# Patient Record
Sex: Male | Born: 1960 | Hispanic: Yes | Marital: Married | Smoking: Never smoker
Health system: Southern US, Community
[De-identification: ages and names within clinical notes are randomized; demographics above are authoritative.]

## PROBLEM LIST (undated history)

## (undated) DIAGNOSIS — F419 Anxiety disorder, unspecified: Secondary | ICD-10-CM

## (undated) DIAGNOSIS — T783XXA Angioneurotic edema, initial encounter: Secondary | ICD-10-CM

## (undated) DIAGNOSIS — L509 Urticaria, unspecified: Secondary | ICD-10-CM

## (undated) DIAGNOSIS — T7840XA Allergy, unspecified, initial encounter: Secondary | ICD-10-CM

## (undated) DIAGNOSIS — Z8619 Personal history of other infectious and parasitic diseases: Secondary | ICD-10-CM

## (undated) DIAGNOSIS — T63481A Toxic effect of venom of other arthropod, accidental (unintentional), initial encounter: Secondary | ICD-10-CM

## (undated) HISTORY — DX: Personal history of other infectious and parasitic diseases: Z86.19

## (undated) HISTORY — DX: Toxic effect of venom of other arthropod, accidental (unintentional), initial encounter: T63.481A

## (undated) HISTORY — DX: Anxiety disorder, unspecified: F41.9

## (undated) HISTORY — DX: Angioneurotic edema, initial encounter: T78.3XXA

## (undated) HISTORY — DX: Allergy, unspecified, initial encounter: T78.40XA

## (undated) HISTORY — DX: Urticaria, unspecified: L50.9

---
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
## 2008-09-18 HISTORY — PX: VASECTOMY: SHX75

## 2016-03-09 DIAGNOSIS — Z8042 Family history of malignant neoplasm of prostate: Secondary | ICD-10-CM | POA: Insufficient documentation

## 2016-03-09 DIAGNOSIS — Z833 Family history of diabetes mellitus: Secondary | ICD-10-CM | POA: Insufficient documentation

## 2016-03-09 DIAGNOSIS — F411 Generalized anxiety disorder: Secondary | ICD-10-CM | POA: Insufficient documentation

## 2016-03-31 HISTORY — PX: COLONOSCOPY: SHX174

## 2016-03-31 LAB — HM COLONOSCOPY

## 2017-01-17 ENCOUNTER — Telehealth: Payer: Self-pay | Admitting: *Deleted

## 2017-01-17 NOTE — Telephone Encounter (Signed)
PreVisit Call attempted. Left VM. 

## 2017-01-18 ENCOUNTER — Ambulatory Visit (INDEPENDENT_AMBULATORY_CARE_PROVIDER_SITE_OTHER): Payer: Commercial Managed Care - HMO | Admitting: Family Medicine

## 2017-01-18 ENCOUNTER — Encounter: Payer: Self-pay | Admitting: Family Medicine

## 2017-01-18 VITALS — BP 110/76 | HR 69 | Temp 98.7°F | Ht 68.0 in | Wt 237.5 lb

## 2017-01-18 DIAGNOSIS — M545 Low back pain, unspecified: Secondary | ICD-10-CM | POA: Insufficient documentation

## 2017-01-18 DIAGNOSIS — E669 Obesity, unspecified: Secondary | ICD-10-CM

## 2017-01-18 DIAGNOSIS — M65339 Trigger finger, unspecified middle finger: Secondary | ICD-10-CM | POA: Diagnosis not present

## 2017-01-18 DIAGNOSIS — M79641 Pain in right hand: Secondary | ICD-10-CM | POA: Insufficient documentation

## 2017-01-18 DIAGNOSIS — G8929 Other chronic pain: Secondary | ICD-10-CM | POA: Diagnosis not present

## 2017-01-18 DIAGNOSIS — M79642 Pain in left hand: Secondary | ICD-10-CM

## 2017-01-18 MED ORDER — TRAMADOL HCL 50 MG PO TABS: 50.0000 mg | ORAL_TABLET | Freq: Three times a day (TID) | ORAL | 0 refills | Status: DC | PRN

## 2017-01-18 MED ORDER — DICLOFENAC SODIUM 75 MG PO TBEC: 75.0000 mg | DELAYED_RELEASE_TABLET | Freq: Two times a day (BID) | ORAL | 0 refills | Status: DC

## 2017-01-18 MED ORDER — GABAPENTIN 300 MG PO CAPS: 300.0000 mg | ORAL_CAPSULE | Freq: Every day | ORAL | 3 refills | Status: DC
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
## 2017-01-20 DIAGNOSIS — S43102A Unspecified dislocation of left acromioclavicular joint, initial encounter: Secondary | ICD-10-CM | POA: Diagnosis not present

## 2017-01-20 DIAGNOSIS — M25512 Pain in left shoulder: Secondary | ICD-10-CM | POA: Diagnosis not present

## 2017-01-20 DIAGNOSIS — R0781 Pleurodynia: Secondary | ICD-10-CM | POA: Diagnosis not present

## 2017-01-22 ENCOUNTER — Ambulatory Visit (INDEPENDENT_AMBULATORY_CARE_PROVIDER_SITE_OTHER): Payer: Commercial Managed Care - HMO | Admitting: Sports Medicine

## 2017-01-22 ENCOUNTER — Encounter: Payer: Self-pay | Admitting: Sports Medicine

## 2017-01-22 ENCOUNTER — Ambulatory Visit: Payer: Self-pay

## 2017-01-22 VITALS — BP 100/70 | HR 61 | Ht 68.0 in | Wt 239.8 lb

## 2017-01-22 DIAGNOSIS — M545 Low back pain: Secondary | ICD-10-CM | POA: Diagnosis not present

## 2017-01-22 DIAGNOSIS — S43102A Unspecified dislocation of left acromioclavicular joint, initial encounter: Secondary | ICD-10-CM | POA: Diagnosis not present

## 2017-01-22 DIAGNOSIS — G5603 Carpal tunnel syndrome, bilateral upper limbs: Secondary | ICD-10-CM

## 2017-01-22 DIAGNOSIS — G8929 Other chronic pain: Secondary | ICD-10-CM | POA: Diagnosis not present
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
## 2017-01-23 DIAGNOSIS — S43102A Unspecified dislocation of left acromioclavicular joint, initial encounter: Secondary | ICD-10-CM | POA: Insufficient documentation

## 2017-01-23 DIAGNOSIS — G5603 Carpal tunnel syndrome, bilateral upper limbs: Secondary | ICD-10-CM | POA: Insufficient documentation

## 2017-01-23 HISTORY — DX: Unspecified dislocation of left acromioclavicular joint, initial encounter: S43.102A
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
## 2017-01-23 NOTE — Assessment & Plan Note (Signed)
This will need to be addressed follow-up appointments.  We did discuss that I do not prescribe chronic narcotics.  He would like referral for this happy to help facilitate referral to pain management.  Otherwise we will be we will try to look further into this at his next several appointments.

## 2017-01-23 NOTE — Assessment & Plan Note (Signed)
Patient will follow up with me in 2 weeks for this for reevaluation.  He is to be out of work for the next 1 week due to lifting obligations.  If any lack of improvement would consider prolonging this.  Will need weighted bilateral AC joint x-rays and follow-up

## 2017-01-30 ENCOUNTER — Telehealth: Payer: Self-pay | Admitting: Family Medicine

## 2017-01-30 NOTE — Telephone Encounter (Signed)
[facility]

## 2017-02-05 ENCOUNTER — Ambulatory Visit (INDEPENDENT_AMBULATORY_CARE_PROVIDER_SITE_OTHER): Payer: Commercial Managed Care - HMO | Admitting: Sports Medicine

## 2017-02-05 ENCOUNTER — Ambulatory Visit (INDEPENDENT_AMBULATORY_CARE_PROVIDER_SITE_OTHER): Payer: Commercial Managed Care - HMO

## 2017-02-05 ENCOUNTER — Encounter: Payer: Self-pay | Admitting: Sports Medicine

## 2017-02-05 VITALS — BP 130/82 | HR 58 | Ht 68.0 in | Wt 236.2 lb

## 2017-02-05 DIAGNOSIS — M79642 Pain in left hand: Secondary | ICD-10-CM | POA: Diagnosis not present

## 2017-02-05 DIAGNOSIS — M545 Low back pain: Secondary | ICD-10-CM | POA: Diagnosis not present

## 2017-02-05 DIAGNOSIS — S4992XA Unspecified injury of left shoulder and upper arm, initial encounter: Secondary | ICD-10-CM | POA: Diagnosis not present

## 2017-02-05 DIAGNOSIS — G5603 Carpal tunnel syndrome, bilateral upper limbs: Secondary | ICD-10-CM

## 2017-02-05 DIAGNOSIS — M25512 Pain in left shoulder: Secondary | ICD-10-CM | POA: Diagnosis not present

## 2017-02-05 DIAGNOSIS — S43102A Unspecified dislocation of left acromioclavicular joint, initial encounter: Secondary | ICD-10-CM

## 2017-02-05 DIAGNOSIS — G8929 Other chronic pain: Secondary | ICD-10-CM | POA: Diagnosis not present

## 2017-02-05 DIAGNOSIS — M79641 Pain in right hand: Secondary | ICD-10-CM | POA: Diagnosis not present

## 2017-02-05 MED ORDER — DICLOFENAC SODIUM 75 MG PO TBEC: 75.0000 mg | DELAYED_RELEASE_TABLET | Freq: Two times a day (BID) | ORAL | 0 refills | Status: DC

## 2017-02-05 MED ORDER — GABAPENTIN 300 MG PO CAPS: 300.0000 mg | ORAL_CAPSULE | Freq: Every day | ORAL | 3 refills | Status: DC

## 2017-02-05 NOTE — Assessment & Plan Note (Addendum)
Symptoms are consistent with carpal tunnel syndrome and are responding to carpal tunnel injections.  If any lack of improvement nerve conduction studies indicated as below.  Continue gabapentin
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
## 2017-02-05 NOTE — Assessment & Plan Note (Signed)
Short term follow-up with expected results at this time.  Only mild persistent symptoms.  If continues to be ongoing nerve conduction studies will be indicated

## 2017-02-05 NOTE — Assessment & Plan Note (Signed)
We did x-rays obtained today are reassuring that this is a stable grade 3 separation.  Given the extensive improvement that he has had anticipate he should do well without surgical intervention.  Discussed that he will have a cosmetic deformity in this area but functionally shoulder she did quite well.  Will need follow this up and ensure complete return to normal activities
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
## 2017-02-22 ENCOUNTER — Telehealth: Payer: Self-pay | Admitting: Family Medicine

## 2017-02-22 NOTE — Telephone Encounter (Signed)
We have requested records but have not received them. 

## 2017-02-22 NOTE — Telephone Encounter (Signed)
[facility] would like to know if we received records on this patient. Please advise.  °

## 2017-02-24 ENCOUNTER — Other Ambulatory Visit: Payer: Self-pay | Admitting: Sports Medicine

## 2017-02-24 DIAGNOSIS — M545 Low back pain: Principal | ICD-10-CM

## 2017-02-24 DIAGNOSIS — G8929 Other chronic pain: Secondary | ICD-10-CM

## 2017-03-01 ENCOUNTER — Telehealth: Payer: Self-pay | Admitting: Sports Medicine

## 2017-03-01 NOTE — Telephone Encounter (Signed)
Patient called to request that we write him a letter releasing him back to work as of 03/05/2017. He feels that his mobility is back to 85%+ and pain is zero to two out of ten.   I am going to put him on the schedule for tomorrow   Please call patient to advise how to proceed. Verified mobile

## 2017-03-01 NOTE — Telephone Encounter (Signed)
Keep scheduled visit, thanks!

## 2017-03-02 ENCOUNTER — Ambulatory Visit: Payer: Self-pay | Admitting: Sports Medicine

## 2017-03-02 ENCOUNTER — Ambulatory Visit (INDEPENDENT_AMBULATORY_CARE_PROVIDER_SITE_OTHER): Payer: 59 | Admitting: Psychology

## 2017-03-02 DIAGNOSIS — F411 Generalized anxiety disorder: Secondary | ICD-10-CM | POA: Diagnosis not present

## 2017-03-05 ENCOUNTER — Encounter: Payer: Self-pay | Admitting: Sports Medicine

## 2017-03-05 ENCOUNTER — Ambulatory Visit (INDEPENDENT_AMBULATORY_CARE_PROVIDER_SITE_OTHER): Payer: 59 | Admitting: Sports Medicine

## 2017-03-05 VITALS — BP 100/80 | HR 61 | Ht 68.0 in | Wt 240.0 lb

## 2017-03-05 DIAGNOSIS — G5603 Carpal tunnel syndrome, bilateral upper limbs: Secondary | ICD-10-CM | POA: Diagnosis not present

## 2017-03-05 DIAGNOSIS — S43102A Unspecified dislocation of left acromioclavicular joint, initial encounter: Secondary | ICD-10-CM | POA: Diagnosis not present

## 2017-03-05 NOTE — Progress Notes (Signed)
OFFICE VISIT NOTE Kyle Haynes. [facility] [phone]  [name] - 56 y.o. male MRN [id number]  Date of birth: 09-19-1960  Visit Date: 03/05/2017  PCP: [name], DO   Referred by: [name], DO  [name], cma acting as scribe for Dr. [name].  SUBJECTIVE:   Chief Complaint  Patient presents with  . Follow-up  . Bilateral Carpel Tunnel  . Left AC Seperation   HPI: As below and per problem based documentation when appropriate.   Bilateral Carpal Tunnel:  Improvement since steroid injections. He is taking Gabapentin 300 mg 1qhs. He reports occasionally numbess with intermittent sharp pains in wrist. Overall he feels much better.   AC separation, left, initial encounter: Improvement--intermittent tenderness with locking and cracking. His sleep is not being interrupted anymore. No knew symptoms. Taking diclofenac 75mg  bid with relief.  No new concerns at this time. He would like a work note to go back to work without restrictions.        Review of Systems  Constitutional: Negative for chills and fever.  HENT: Negative.   Eyes: Negative.   Respiratory: Negative.   Cardiovascular: Negative.  Negative for chest pain and palpitations.  Gastrointestinal: Negative.   Genitourinary: Negative.   Musculoskeletal: Positive for joint pain and myalgias.  Skin: Negative for itching and rash.  Neurological: Negative.  Negative for dizziness, tingling and headaches.  Endo/Heme/Allergies: Negative for environmental allergies and polydipsia. Does not bruise/bleed easily.  Psychiatric/Behavioral: Negative.     Otherwise per HPI.  HISTORY & PERTINENT PRIOR DATA:  No specialty comments available. He reports that he has never smoked. He has never used smokeless tobacco. No results for input(s): HGBA1C, LABURIC in the last 8760 hours. Medications & Allergies reviewed per EMR Patient Active Problem List   Diagnosis Date Noted  . Bipolar 2 disorder (HCC) 03/09/2017  . AC separation, left, initial encounter 01/23/2017  . Bilateral carpal tunnel syndrome 01/23/2017  . Chronic midline low back pain without sciatica 01/18/2017  . Trigger middle finger 01/18/2017  . Pain in both hands 01/18/2017  . Obesity (BMI 30.0-34.9) 01/18/2017  . Family history of diabetes mellitus type II 03/09/2016  . Family history of prostate cancer 03/09/2016  . GAD (generalized anxiety disorder) 03/09/2016   Past Medical History:  Diagnosis Date  . Allergy   . Anxiety   . History of chicken pox    History reviewed. No pertinent family history. Past Surgical History:  Procedure Laterality Date  . EYE SURGERY Left 1972   detached retina  . VASECTOMY  2010   Social History   Occupational History  . Not on file.   Social History Main Topics  . Smoking status: Never Smoker  . Smokeless tobacco: Never Used  . Alcohol use No  . Drug use: No  . Sexual activity: Yes    OBJECTIVE:  VS:  HT:5\' 8"  (172.7 cm)   WT:240 lb (108.9 kg)  BMI:36.6    BP:100/80  HR:61bpm  TEMP: ( )  RESP:96 % EXAM: Findings:  Bilateral upper extremities overall well aligned.  No significant deformity.  2+/4 and symmetric.  Upper extremity sensation is intact light touch. Bilateral radial pulses.  Left shoulder has forming over the left AC joint but is not painful to palpation.  He has no pain with overhead range of motion and has full flexion, extension, abduction, internal rotation and external rotation.  There is no significant dynamic subluxation although  he does have some motion when in this.  He is able to do a push-up type motion without pain.     No results found. ASSESSMENT & PLAN:   Problem List Items Addressed This Visit    AC separation, left, initial encounter - Primary    Grade 3 separation has healed remarkably well.  No significant work restrictions at this time.  He does understand that if this continues to  be mobile or shifting on him this can lead to increased wear on the joint and have provided him with shoulder stabilization exercises to be performed daily basis.      Bilateral carpal tunnel syndrome    Significantly improved following bilateral carpal tunnel injections.  No significant pain or persistent numbness or tingling.         Follow-up: Return if symptoms worsen or fail to improve.   CMA/ATC served as Neurosurgeonscribe during this visit. History, Physical, and Plan performed by medical provider. Documentation and orders reviewed and attested to.      [name], DO    [name]

## 2017-03-05 NOTE — Patient Instructions (Signed)
Please perform the exercise program that Fayrene FearingJames has prepared for you and gone over in detail on a daily basis.  In addition to the handout you were provided you can access your program through: [email or web address]   Your unique program code is: [email or web address]

## 2017-03-09 ENCOUNTER — Encounter: Payer: Self-pay | Admitting: Family Medicine

## 2017-03-09 ENCOUNTER — Ambulatory Visit (INDEPENDENT_AMBULATORY_CARE_PROVIDER_SITE_OTHER): Payer: Commercial Managed Care - HMO | Admitting: Family Medicine

## 2017-03-09 VITALS — BP 120/72 | HR 60 | Temp 98.0°F | Ht 68.0 in | Wt 242.0 lb

## 2017-03-09 DIAGNOSIS — E669 Obesity, unspecified: Secondary | ICD-10-CM

## 2017-03-09 DIAGNOSIS — F3181 Bipolar II disorder: Secondary | ICD-10-CM | POA: Insufficient documentation

## 2017-03-09 MED ORDER — LAMOTRIGINE 25 & 50 & 100 MG PO KIT: PACK | ORAL | 0 refills | Status: DC

## 2017-03-09 NOTE — Progress Notes (Signed)
Kyle Haynes is a 56 y.o. male is here for follow up.  History of Present Illness:   Kyle Haynes CMA acting as scribe for Dr. Juleen China.  HPI Patient comes in today to discuss being put on a mood stabilizer. He has been on Lamictal in the past that seemed to help. Following with Dr. Melburn Hake twice a month. Hx of rapid cycling. No SI/HI. No tobacco, ETOH, or drug use. Worried about drive to spend money.  Health Maintenance Due  Topic Date Due  . Hepatitis C Screening  [birth year]  . HIV Screening  01/10/1976  . TETANUS/TDAP  01/10/1980  . COLONOSCOPY  01/10/2011   PMHx, SurgHx, SocialHx, FamHx, Medications, and Allergies were reviewed in the Visit Navigator and updated as appropriate.   Patient Active Problem List   Diagnosis Date Noted  . Bipolar 2 disorder ([place]) 03/09/2017  . AC separation, left, initial encounter 01/23/2017  . Bilateral carpal tunnel syndrome 01/23/2017  . Chronic midline low back pain without sciatica 01/18/2017  . Trigger middle finger 01/18/2017  . Pain in both hands 01/18/2017  . Obesity (BMI 30.0-34.9) 01/18/2017  . Family history of diabetes mellitus type II 03/09/2016  . Family history of prostate cancer 03/09/2016  . GAD (generalized anxiety disorder) 03/09/2016   Social History  Substance Use Topics  . Smoking status: Never Smoker  . Smokeless tobacco: Never Used  . Alcohol use No   Current Medications and Allergies:   Current Outpatient Prescriptions:  .  diclofenac (VOLTAREN) 75 MG EC tablet, TAKE 1 TABLET BY MOUTH TWICE A DAY, Disp: 30 tablet, Rfl: 0 .  FLUoxetine (PROZAC) 20 MG capsule, Take 40 mg by mouth daily. , Disp: , Rfl:  .  gabapentin (NEURONTIN) 300 MG capsule, Take 1 capsule (300 mg total) by mouth at bedtime., Disp: 30 capsule, Rfl: 3  Not on File   Review of Systems   Review of Systems  Constitutional: Negative for chills, fever and malaise/fatigue.  HENT: Negative for ear pain, sinus pain and sore throat.   Eyes:  Negative for blurred vision and double vision.  Respiratory: Negative for cough, shortness of breath and wheezing.   Cardiovascular: Negative for chest pain, palpitations and leg swelling.  Gastrointestinal: Negative for abdominal pain, nausea and vomiting.  Musculoskeletal: Negative for back pain, joint pain and neck pain.  Neurological: Negative for dizziness and headaches.  Psychiatric/Behavioral: Negative for depression, hallucinations, memory loss and suicidal ideas.   Vitals:   Vitals:   03/09/17 1053  BP: 120/72  Pulse: 60  Temp: 98 F (36.7 C)  TempSrc: Oral  SpO2: 97%  Weight: 242 lb (109.8 kg)  Height: 5' 8"  (1.727 m)     Body mass index is 36.8 kg/m.   Physical Exam:   Physical Exam  Constitutional: He is oriented to person, place, and time. He appears well-developed and well-nourished. No distress.  HENT:  Head: Normocephalic and atraumatic.  Right Ear: External ear normal.  Left Ear: External ear normal.  Nose: Nose normal.  Mouth/Throat: Oropharynx is clear and moist.  Eyes: Conjunctivae and EOM are normal. Pupils are equal, round, and reactive to light.  Neck: Normal range of motion. Neck supple.  Cardiovascular: Normal rate, regular rhythm, normal heart sounds and intact distal pulses.   Pulmonary/Chest: Effort normal and breath sounds normal.  Abdominal: Soft. Bowel sounds are normal.  Musculoskeletal: Normal range of motion.  Neurological: He is alert and oriented to person, place, and time.  Skin: Skin is warm and  dry.  Psychiatric: He has a normal mood and affect. His behavior is normal. Judgment and thought content normal.  Nursing note and vitals reviewed.    Assessment and Plan:   Diagnoses and all orders for this visit:  Bipolar 2 disorder ([place]) Comments: After discussion, patient would like to start below medication. Expectations, risks, and potential side effects reviewed. Will recheck in one month. Orders: -     LamoTRIgine (LAMICTAL  ODT) 25 & 50 & 100 MG KIT; Orange starter pack.  Obesity (BMI 30.0-34.9) Comments: The patient is asked to make an attempt to improve diet and exercise patterns to aid in medical management of this problem.     . Reviewed expectations re: course of current medical issues. . Discussed self-management of symptoms. . Outlined signs and symptoms indicating need for more acute intervention. . Patient verbalized understanding and all questions were answered. Marland Kitchen Health Maintenance issues including appropriate healthy diet, exercise, and smoking avoidance were discussed with patient. . See orders for this visit as documented in the electronic medical record. . Patient received an After Visit Summary.  CMA served as Education administrator during this visit. History, Physical, and Plan performed by medical provider. The above documentation has been reviewed and is accurate and complete. Briscoe Deutscher, D.O.  Briscoe Deutscher, DO [place] 03/09/2017  Future Appointments Date Time Provider [place]  04/06/2017 11:00 AM Briscoe Deutscher, DO [facility] None

## 2017-03-18 NOTE — Assessment & Plan Note (Signed)
Grade 3 separation has healed remarkably well.  No significant work restrictions at this time.  He does understand that if this continues to be mobile or shifting on him this can lead to increased wear on the joint and have provided him with shoulder stabilization exercises to be performed daily basis.

## 2017-03-18 NOTE — Assessment & Plan Note (Signed)
Significantly improved following bilateral carpal tunnel injections.  No significant pain or persistent numbness or tingling.

## 2017-03-20 ENCOUNTER — Ambulatory Visit (INDEPENDENT_AMBULATORY_CARE_PROVIDER_SITE_OTHER): Payer: 59 | Admitting: Psychology

## 2017-03-20 DIAGNOSIS — F411 Generalized anxiety disorder: Secondary | ICD-10-CM | POA: Diagnosis not present

## 2017-04-06 ENCOUNTER — Ambulatory Visit: Payer: Self-pay | Admitting: Family Medicine

## 2017-04-06 DIAGNOSIS — Z0289 Encounter for other administrative examinations: Secondary | ICD-10-CM

## 2017-04-09 ENCOUNTER — Ambulatory Visit: Payer: Self-pay | Admitting: Family Medicine

## 2017-04-09 DIAGNOSIS — Z0289 Encounter for other administrative examinations: Secondary | ICD-10-CM

## 2017-04-13 ENCOUNTER — Ambulatory Visit (INDEPENDENT_AMBULATORY_CARE_PROVIDER_SITE_OTHER): Payer: 59 | Admitting: Family Medicine

## 2017-04-13 ENCOUNTER — Encounter: Payer: Self-pay | Admitting: Family Medicine

## 2017-04-13 VITALS — BP 118/70 | HR 59 | Temp 98.1°F | Ht 68.0 in | Wt 240.0 lb

## 2017-04-13 DIAGNOSIS — Z23 Encounter for immunization: Secondary | ICD-10-CM

## 2017-04-13 DIAGNOSIS — Z0000 Encounter for general adult medical examination without abnormal findings: Secondary | ICD-10-CM

## 2017-04-13 DIAGNOSIS — F3181 Bipolar II disorder: Secondary | ICD-10-CM

## 2017-04-13 DIAGNOSIS — Z1159 Encounter for screening for other viral diseases: Secondary | ICD-10-CM

## 2017-04-13 MED ORDER — LAMOTRIGINE 100 MG PO TABS
100.0000 mg | ORAL_TABLET | Freq: Every day | ORAL | 1 refills | Status: DC
Start: 2017-04-13 — End: 2017-09-30

## 2017-04-13 MED ORDER — FLUOXETINE HCL 20 MG PO CAPS: 40.0000 mg | ORAL_CAPSULE | Freq: Every day | ORAL | 2 refills | Status: DC

## 2017-04-13 NOTE — Progress Notes (Signed)
Kyle Haynes is a 56 y.o. male is here for follow up.  History of Present Illness:   [name] CMA acting as scribe for Dr. Juleen China.  HPI: Patient comes in today to follow up for his BiPolar disorder. He has been taking the Lamictal starter pack 25 mg, 50 mg, and finishing with 100 mg. He would like to stay on the 100 mg. He has been tolerating well with no side effects. He has seen a big difference in this mood. He has been taking Prozac 20 mg for several years. Tolerates Prozac as well. Will send in a refill of the Prozac 20 mg and Lamictal 100 mg today.   He is requesting a Physical because he has not had one several years. Would like routine labs today. Will order future fasting labs today.   Health Maintenance Due  Topic Date Due  . Hepatitis C Screening  [birth year] 1961  . HIV Screening  01/10/1976  . TETANUS/TDAP  01/10/1980  . COLONOSCOPY  01/10/2011   Depression screen PHQ 2/9 01/18/2017  Decreased Interest 0  Down, Depressed, Hopeless 0  PHQ - 2 Score 0   PMHx, SurgHx, SocialHx, FamHx, Medications, and Allergies were reviewed in the Visit Navigator and updated as appropriate.   Patient Active Problem List   Diagnosis Date Noted  . Bipolar 2 disorder ([place]) 03/09/2017  . AC separation, left, initial encounter 01/23/2017  . Bilateral carpal tunnel syndrome 01/23/2017  . Chronic midline low back pain without sciatica 01/18/2017  . Trigger middle finger 01/18/2017  . Pain in both hands 01/18/2017  . Obesity (BMI 30.0-34.9) 01/18/2017  . Family history of diabetes mellitus type II 03/09/2016  . Family history of prostate cancer 03/09/2016  . GAD (generalized anxiety disorder) 03/09/2016   Social History  Substance Use Topics  . Smoking status: Never Smoker  . Smokeless tobacco: Never Used  . Alcohol use No   Current Medications and Allergies:   .  diclofenac (VOLTAREN) 75 MG EC tablet, TAKE 1 TABLET BY MOUTH TWICE A DAY, Disp: 30 tablet, Rfl: 0 .  FLUoxetine  (PROZAC) 20 MG capsule, Take 40 mg by mouth daily. , Disp: , Rfl:  .  gabapentin (NEURONTIN) 300 MG capsule, Take 1 capsule (300 mg total) by mouth at bedtime., Disp: 30 capsule, Rfl: 3 .  LamoTRIgine (LAMICTAL ODT) 25 & 50 & 100 MG KIT, Orange starter pack., Disp: 35 each, Rfl: 0  No Known Allergies   Review of Systems   Pertinent items are noted in the HPI. Otherwise, ROS is negative.  Vitals:   Vitals:   04/13/17 1011  BP: 118/70  Pulse: (!) 59  Temp: 98.1 F (36.7 C)  TempSrc: Oral  SpO2: 97%  Weight: 240 lb (108.9 kg)  Height: _0  (1.727 m)     Body mass index is 36.49 kg/m.  Physical Exam:   Physical Exam  Constitutional: He is oriented to person, place, and time. He appears well-developed and well-nourished. No distress.  HENT:  Head: Normocephalic and atraumatic.  Right Ear: External ear normal.  Left Ear: External ear normal.  Nose: Nose normal.  Mouth/Throat: Oropharynx is clear and moist.  Eyes: Pupils are equal, round, and reactive to light. Conjunctivae and EOM are normal.  Neck: Normal range of motion. Neck supple.  Cardiovascular: Normal rate, regular rhythm, normal heart sounds and intact distal pulses.   Pulmonary/Chest: Effort normal and breath sounds normal.  Abdominal: Soft. Bowel sounds are normal.  Musculoskeletal: Normal range of  motion.  Neurological: He is alert and oriented to person, place, and time.  Skin: Skin is warm and dry.  Psychiatric: He has a normal mood and affect. His behavior is normal. Judgment and thought content normal.  Nursing note and vitals reviewed.   Assessment and Plan:   Kyle Haynes was seen today for follow-up.  Diagnoses and all orders for this visit:  Routine physical examination -     CBC with Differential/Platelet; Future -     Comprehensive metabolic panel; Future -     Lipid panel; Future -     Hemoglobin A1c; Future -     Hepatitis C antibody, reflex; Future  Bipolar 2 disorder  ([place]) Comments: Continue current treatment. Orders: -     CBC with Differential/Platelet; Future -     Comprehensive metabolic panel; Future -     Lipid panel; Future -     Hemoglobin A1c; Future -     lamoTRIgine (LAMICTAL) 100 MG tablet; Take 1 tablet (100 mg total) by mouth daily. -     FLUoxetine (PROZAC) 20 MG capsule; Take 2 capsules (40 mg total) by mouth daily.  Encounter for hepatitis C screening test for low risk patient -     Hepatitis C antibody, reflex; Future  Need for prophylactic vaccination against diphtheria-tetanus-pertussis (DTP) -     Tdap vaccine greater than or equal to 7yo IM   Patient Counseling: _0   Nutrition: Stressed importance of moderation in sodium/caffeine intake, saturated fat and cholesterol, caloric balance, sufficient intake of fresh fruits, vegetables, and fiber  _1   Stressed the importance of regular exercise.   _2   Substance Abuse: Discussed cessation/primary prevention of tobacco, alcohol, or other drug use; driving or other dangerous activities under the influence; availability of treatment for abuse.   _3   Injury prevention: Discussed safety belts, safety helmets, smoke detector, smoking near bedding or upholstery.   _4   Sexuality: Discussed sexually transmitted diseases, partner selection, use of condoms, avoidance of unintended pregnancy  and contraceptive alternatives.   _5   Dental health: Discussed importance of regular tooth brushing, flossing, and dental visits.  _6   Health maintenance and immunizations reviewed. Please refer to Health maintenance section.    . Reviewed expectations re: course of current medical issues. . Discussed self-management of symptoms. . Outlined signs and symptoms indicating need for more acute intervention. . Patient verbalized understanding and all questions were answered. Marland Kitchen Health Maintenance issues including appropriate healthy diet, exercise, and smoking avoidance were discussed with patient. . See  orders for this visit as documented in the electronic medical record. . Patient received an After Visit Summary.  CMA served as Education administrator during this visit. History, Physical, and Plan performed by medical provider. The above documentation has been reviewed and is accurate and complete. Briscoe Deutscher, D.O.  Briscoe Deutscher, DO [place] 04/14/2017  Future Appointments Date Time Provider [place]  04/19/2017 11:00 AM Shelor Sherrilyn Rist,  [facility] None  07/11/2017 1:45 PM Briscoe Deutscher, DO [facility] None

## 2017-04-19 ENCOUNTER — Ambulatory Visit: Payer: Self-pay | Admitting: Psychology

## 2017-05-25 DIAGNOSIS — N400 Enlarged prostate without lower urinary tract symptoms: Secondary | ICD-10-CM | POA: Diagnosis not present

## 2017-05-25 DIAGNOSIS — R109 Unspecified abdominal pain: Secondary | ICD-10-CM | POA: Diagnosis not present

## 2017-05-25 DIAGNOSIS — N281 Cyst of kidney, acquired: Secondary | ICD-10-CM | POA: Diagnosis not present

## 2017-05-25 DIAGNOSIS — R140 Abdominal distension (gaseous): Secondary | ICD-10-CM | POA: Diagnosis not present

## 2017-05-25 DIAGNOSIS — K5730 Diverticulosis of large intestine without perforation or abscess without bleeding: Secondary | ICD-10-CM | POA: Diagnosis not present

## 2017-07-11 ENCOUNTER — Ambulatory Visit: Payer: 59 | Admitting: Family Medicine

## 2017-07-11 ENCOUNTER — Ambulatory Visit (INDEPENDENT_AMBULATORY_CARE_PROVIDER_SITE_OTHER): Payer: 59 | Admitting: Family Medicine

## 2017-07-11 ENCOUNTER — Encounter: Payer: Self-pay | Admitting: Family Medicine

## 2017-07-11 VITALS — BP 100/70 | HR 67 | Ht 68.0 in | Wt 243.0 lb

## 2017-07-11 DIAGNOSIS — E782 Mixed hyperlipidemia: Secondary | ICD-10-CM | POA: Diagnosis not present

## 2017-07-11 DIAGNOSIS — Z0000 Encounter for general adult medical examination without abnormal findings: Secondary | ICD-10-CM | POA: Diagnosis not present

## 2017-07-11 DIAGNOSIS — F3181 Bipolar II disorder: Secondary | ICD-10-CM

## 2017-07-11 DIAGNOSIS — Z1159 Encounter for screening for other viral diseases: Secondary | ICD-10-CM | POA: Diagnosis not present

## 2017-07-11 LAB — COMPREHENSIVE METABOLIC PANEL
ALT: 18 U/L (ref 0–53)
AST: 16 U/L (ref 0–37)
Albumin: 4.1 g/dL (ref 3.5–5.2)
Alkaline Phosphatase: 75 U/L (ref 39–117)
BUN: 17 mg/dL (ref 6–23)
CO2: 24 mEq/L (ref 19–32)
Calcium: 9.4 mg/dL (ref 8.4–10.5)
Chloride: 107 mEq/L (ref 96–112)
Creatinine, Ser: 0.86 mg/dL (ref 0.40–1.50)
GFR: 97.6 mL/min (ref >60.00)
Glucose, Bld: 119 mg/dL — ABNORMAL HIGH (ref 70–99)
Potassium: 4 mEq/L (ref 3.5–5.1)
Sodium: 139 mEq/L (ref 135–145)
Total Bilirubin: 0.4 mg/dL (ref 0.2–1.2)
Total Protein: 6.8 g/dL (ref 6.0–8.3)

## 2017-07-11 LAB — CBC WITH DIFFERENTIAL/PLATELET
Basophils Absolute: 0 10*3/uL (ref 0.0–0.1)
Basophils Relative: 0.6 % (ref 0.0–3.0)
Eosinophils Absolute: 0.1 10*3/uL (ref 0.0–0.7)
Eosinophils Relative: 1.6 % (ref 0.0–5.0)
HCT: 41.7 % (ref 39.0–52.0)
Hemoglobin: 14.1 g/dL (ref 13.0–17.0)
Lymphocytes Relative: 34.5 % (ref 12.0–46.0)
Lymphs Abs: 1.6 10*3/uL (ref 0.7–4.0)
MCHC: 33.7 g/dL (ref 30.0–36.0)
MCV: 88 fl (ref 78.0–100.0)
Monocytes Absolute: 0.2 10*3/uL (ref 0.1–1.0)
Monocytes Relative: 5.1 % (ref 3.0–12.0)
Neutro Abs: 2.8 10*3/uL (ref 1.4–7.7)
Neutrophils Relative %: 58.2 % (ref 43.0–77.0)
Platelets: 292 10*3/uL (ref 150.0–400.0)
RBC: 4.74 Mil/uL (ref 4.22–5.81)
RDW: 13.7 % (ref 11.5–15.5)
WBC: 4.7 10*3/uL (ref 4.0–10.5)

## 2017-07-11 LAB — LIPID PANEL
Cholesterol: 214 mg/dL — ABNORMAL HIGH (ref 0–200)
HDL: 27.4 mg/dL — ABNORMAL LOW (ref >39.00)
Total CHOL/HDL Ratio: 8
Triglycerides: 447 mg/dL — ABNORMAL HIGH (ref 0.0–149.0)

## 2017-07-11 LAB — HEMOGLOBIN A1C: Hgb A1c MFr Bld: 5.9 % (ref 4.6–6.5)

## 2017-07-11 LAB — LDL CHOLESTEROL, DIRECT: Direct LDL: 106 mg/dL

## 2017-07-11 NOTE — Patient Instructions (Signed)
No changes today.  We will check blood work.  Come back to see Dr Earlene PlaterWallace in 6 months.  Take care,  Dr Jimmey RalphParker

## 2017-07-11 NOTE — Progress Notes (Signed)
    Subjective:  Kyle Haynes is a 56 y.o. male who presents today with a chief complaint of bipolar 2 follow-up.   HPI:  Bipolar 2 Disorder, chronic issue Currently on Prozac 20 mg daily and Lamictal 100 mg daily.  Stable on this dose.  Feels like his mood is doing well.  No irritability.  No depressed mood.  No suicidal ideation.  No noticeable side effects from his medications.  Preventative healthcare Reports that he had his colonoscopy about a months ago.  ROS: Per HPI  Objective:  Physical Exam: BP 100/70   Pulse 67   Ht 5\' 8"  (1.727 m)   Wt 243 lb (110.2 kg)   SpO2 96%   BMI 36.95 kg/m   Gen: NAD, resting comfortably Neuro: Grossly normal, moves all extremities Psych: Normal affect and thought content  Assessment/Plan:  Bipolar 2 disorder (HCC) Stable on Prozac and Lamictal.  No change today.  Follow-up with PCP in 6 months.  Katina Degreealeb M. Jimmey RalphParker, MD 07/11/2017 12:20 PM

## 2017-07-11 NOTE — Assessment & Plan Note (Signed)
Stable on Prozac and Lamictal.  No change today.  Follow-up with PCP in 6 months.

## 2017-07-12 LAB — HEPATITIS C ANTIBODY
Hepatitis C Ab: NONREACTIVE
SIGNAL TO CUT-OFF: 0.02 (ref <1.00)

## 2017-07-22 DIAGNOSIS — E782 Mixed hyperlipidemia: Secondary | ICD-10-CM | POA: Insufficient documentation

## 2017-07-22 MED ORDER — SIMVASTATIN 40 MG PO TABS: 40.0000 mg | ORAL_TABLET | Freq: Every day | ORAL | 3 refills | Status: DC

## 2017-07-22 NOTE — Addendum Note (Signed)
Addended by: [name], Viktoriya Glaspy R on: 07/22/2017 08:48 PM   Modules accepted: Orders

## 2017-08-01 ENCOUNTER — Other Ambulatory Visit: Payer: Self-pay | Admitting: Family Medicine

## 2017-08-01 DIAGNOSIS — F3181 Bipolar II disorder: Secondary | ICD-10-CM

## 2017-08-01 NOTE — Telephone Encounter (Signed)
Please advise on refill. Historical provider.  

## 2017-09-30 ENCOUNTER — Other Ambulatory Visit: Payer: Self-pay | Admitting: Family Medicine

## 2017-09-30 DIAGNOSIS — F3181 Bipolar II disorder: Secondary | ICD-10-CM

## 2017-11-04 ENCOUNTER — Other Ambulatory Visit: Payer: Self-pay | Admitting: Family Medicine

## 2017-11-04 DIAGNOSIS — F3181 Bipolar II disorder: Secondary | ICD-10-CM

## 2017-11-22 ENCOUNTER — Ambulatory Visit: Payer: 59 | Admitting: Sports Medicine

## 2017-11-22 ENCOUNTER — Encounter: Payer: Self-pay | Admitting: Sports Medicine

## 2017-11-22 VITALS — BP 102/72 | HR 56 | Ht 68.0 in

## 2017-11-22 DIAGNOSIS — M545 Low back pain: Secondary | ICD-10-CM

## 2017-11-22 DIAGNOSIS — S43102D Unspecified dislocation of left acromioclavicular joint, subsequent encounter: Secondary | ICD-10-CM | POA: Diagnosis not present

## 2017-11-22 DIAGNOSIS — M65331 Trigger finger, right middle finger: Secondary | ICD-10-CM | POA: Diagnosis not present

## 2017-11-22 DIAGNOSIS — M79642 Pain in left hand: Secondary | ICD-10-CM | POA: Diagnosis not present

## 2017-11-22 DIAGNOSIS — G8929 Other chronic pain: Secondary | ICD-10-CM

## 2017-11-22 DIAGNOSIS — M79641 Pain in right hand: Secondary | ICD-10-CM

## 2017-11-22 MED ORDER — IBUPROFEN-FAMOTIDINE 800-26.6 MG PO TABS: 1.0000 | ORAL_TABLET | Freq: Three times a day (TID) | ORAL | 2 refills | Status: DC | PRN

## 2017-11-22 MED ORDER — IBUPROFEN-FAMOTIDINE 800-26.6 MG PO TABS: 1.0000 | ORAL_TABLET | Freq: Three times a day (TID) | ORAL | 0 refills | Status: DC | PRN

## 2017-11-22 NOTE — Progress Notes (Signed)
Veverly FellsMichael D. Delorise Shinerigby, DO  [place] [facility] [place] [phone]  Kyle Haynes - 57 y.o. male MRN [id number]  Date of birth: Nov 21, 1960  Visit Date: 11/22/2017  PCP: [name], [name], DO   Referred by: [name], [name], DO   Scribe for today's visit: Stevenson ClinchBrandy Coleman, CMA     SUBJECTIVE:  Kyle Haynes is here for Initial Assessment (Trigger finger and back pain) .   Info from last visit on 03/05/17: Bilateral Carpal Tunnel:  Improvement since steroid injections. He is taking Gabapentin 300 mg 1qhs. He reports occasionally numbess with intermittent sharp pains in wrist. Overall he feels much better.  AC separation, left, initial encounter: Improvement--intermittent tenderness with locking and cracking. His sleep is not being interrupted anymore. No knew symptoms. Taking diclofenac 75mg  bid with relief. No new concerns at this time. He would like a work note to go back to work without restrictions.   11/22/17: Mid-line low back pain w/o sciatica Compared to the last office visit, his previously described symptoms are improving, pain is not as severe. Pain is worse when working, he does a lot of bending and lifting. He missed work Friday d/t pain. He reports some pain when lying in bed.  Current symptoms are moderate (4/10) & are non-radiating (no pain in gluteal region , hips, or groin).  He has tried IBU in the past with minimal relief. He has tried using heat and ice with minimal relief. He has also tried using a Scientist, clinical (histocompatibility and immunogenetics)massager with short term relief. He did PT for back pain years ago and didn't feel like it was beneficial.   He reports continued L shoulder pain. Pain has improved. He hears and feels cracking and popping when he moves his shoulder. ROM has improved.   Pt c/o catching and locking of 3rd finger R hand. He has had tenderness at the [facility]. He has noticed decreased grip strength. He has tried pulling his finger to release it but that caused  a lot of pain. He did get some relief with injections in the past. He denies swelling in hand/fingers.  He also c/o R elbow pain which is worse while sleeping.      ROS Denies night time disturbances. Denies fevers, chills, or night sweats. Denies unexplained weight loss. Denies personal history of cancer. Denies changes in bowel or bladder habits. Denies recent unreported falls. Denies new or worsening dyspnea or wheezing. Denies headaches or dizziness.  Reports numbness in hands while sleeping Denies dizziness or presyncopal episodes Reports lower extremity edema, bilateral feet   HISTORY & PERTINENT PRIOR DATA:  Prior History reviewed and updated per electronic medical record.  Significant/pertinent history, findings, studies include:  reports that he has never smoked. He has never used smokeless tobacco. Recent Labs    07/11/17 1130  HGBA1C 5.9   No specialty comments available. No problems updated.  OBJECTIVE:  VS:  HT:5\' 8"  (172.7 cm)   WT:   BMI:     BP:102/72  HR:(Abnormal) 56bpm  TEMP: ( )  RESP:96 %   PHYSICAL EXAM: Constitutional: WDWN, Non-toxic appearing. Psychiatric: Alert & appropriately interactive.  Not depressed or anxious appearing. Respiratory: No increased work of breathing.  Trachea Midline Eyes: Pupils are equal.  EOM intact without nystagmus.  No scleral icterus  Vascular Exam: warm to touch no edema  upper and lower extremity neuro exam: unremarkable normal strength normal sensation normal reflexes  MSK Exam: Right hand has triggering with small amount of pain  over the A1 pulley of the right hand.  He has a small amount of triggering over both fingers.  His left shoulder is overall healing well with only minimal pain.  He does have some back pain with straight leg raise but no significant lower extremity reflex abnormality or strength issues.  He does have markedly tight hamstrings.   ASSESSMENT & PLAN:   1. Trigger middle finger  of right hand   2. Pain in both hands   3. Chronic midline low back pain without sciatica   4. AC separation, left, initial encounter     PLAN: Anti-inflammatories and generalized low back pain exercises provided today without formal therapeutic exercise review.  Pain lack of improvement we will plan to inject his fingers as needed and follow-up with potential for formal physical therapy.  Follow-up: Return in about 4 weeks (around 12/20/2017).      Please see additional documentation for Objective, Assessment and Plan sections. Pertinent additional documentation may be included in corresponding procedure notes, imaging studies, problem based documentation and patient instructions. Please see these sections of the encounter for additional information regarding this visit.  CMA/ATC served as Neurosurgeon during this visit. History, Physical, and Plan performed by medical provider. Documentation and orders reviewed and attested to.      Andrena Mews, DO    [place] [facility] Physician

## 2017-11-22 NOTE — Patient Instructions (Signed)
[facility] instructions for Duexis, Pennsaid and Vimovo:  Your prescription will be filled through a mail order pharmacy.  It is typically [facility] but may vary depending on where you live.  You will receive a phone call from them which will typically come from a [phone] phone number.  You must speak directly to them to have this medication filled.  When the pharmacy calls, they will need your mailing address (for overnight shipment of the medication) and they will need payment information if you have a copay (typically no more than $10). If you have not heard from them 2-3 days after your appointment with Dr. Berline [facility], contact us at the office [phone]) or through MyChart so we can reach back out to the pharmacy.

## 2017-11-28 ENCOUNTER — Encounter: Payer: Self-pay | Admitting: Sports Medicine

## 2017-12-05 ENCOUNTER — Encounter: Payer: Self-pay | Admitting: Physician Assistant

## 2017-12-05 ENCOUNTER — Ambulatory Visit (INDEPENDENT_AMBULATORY_CARE_PROVIDER_SITE_OTHER): Payer: 59 | Admitting: Physician Assistant

## 2017-12-05 VITALS — BP 128/82 | HR 60 | Temp 98.4°F | Ht 68.0 in | Wt 244.2 lb

## 2017-12-05 DIAGNOSIS — J039 Acute tonsillitis, unspecified: Secondary | ICD-10-CM

## 2017-12-05 MED ORDER — AMOXICILLIN-POT CLAVULANATE 875-125 MG PO TABS
1.0000 | ORAL_TABLET | Freq: Two times a day (BID) | ORAL | 0 refills | Status: DC
Start: 2017-12-05 — End: 2018-02-12

## 2017-12-05 MED ORDER — GUAIFENESIN-CODEINE 100-10 MG/5ML PO SYRP: 5.0000 mL | ORAL_SOLUTION | Freq: Three times a day (TID) | ORAL | 0 refills | Status: DC | PRN

## 2017-12-05 NOTE — Progress Notes (Signed)
[facility] is a 57 y.o. male here for a new problem.   History of Present Illness:   Chief Complaint  Patient presents with  . Cough    Non-Productive   . Nasal Congestion  . Sore Throat    3-17 burning sensation    HPI   Throat has been burning for the past 3 days. Cough is mostly dry. Has developed nasal congestion. Has used saline nasal spray. Mother in law has been sick.  Has been taking Mucinex. Denies fever. Appetite decreased. Trying to push fluids. Denies SOB, stridor, chest pain.     Past Medical History:  Diagnosis Date  . Allergy   . Anxiety   . History of chicken pox      Social History   Socioeconomic History  . Marital status: Married    Spouse name: Not on file  . Number of children: Not on file  . Years of education: Not on file  . Highest education level: Not on file  Social Needs  . Financial resource strain: Not on file  . Food insecurity - worry: Not on file  . Food insecurity - inability: Not on file  . Transportation needs - medical: Not on file  . Transportation needs - non-medical: Not on file  Occupational History  . Not on file  Tobacco Use  . Smoking status: Never Smoker  . Smokeless tobacco: Never Used  Substance and Sexual Activity  . Alcohol use: No  . Drug use: No  . Sexual activity: Yes  Other Topics Concern  . Not on file  Social History Narrative  . Not on file    Past Surgical History:  Procedure Laterality Date  . EYE SURGERY Left 1972   detached retina  . VASECTOMY  2010    History reviewed. No pertinent family history.  No Known Allergies  Current Medications:   Current Outpatient Medications:  .  amoxicillin-clavulanate (AUGMENTIN) 875-125 MG tablet, Take 1 tablet by mouth 2 (two) times daily., Disp: 20 tablet, Rfl: 0 .  FLUoxetine (PROZAC) 20 MG capsule, TAKE 2 CAPSULES (40 MG TOTAL) BY MOUTH DAILY, Disp: 60 capsule, Rfl: 2 .  gabapentin (NEURONTIN) 300 MG capsule, Take 1 capsule (300 mg total) by  mouth at bedtime., Disp: 30 capsule, Rfl: 3 .  guaiFENesin-codeine (ROBITUSSIN AC) 100-10 MG/5ML syrup, Take 5 mLs by mouth 3 (three) times daily as needed for cough., Disp: 120 mL, Rfl: 0 .  Ibuprofen-Famotidine (DUEXIS) 800-26.6 MG TABS, Take 1 tablet by mouth 3 (three) times daily as needed. 1 tab po tid X 14 days then 1 tab po tid as needed, Disp: 90 tablet, Rfl: 2 .  Ibuprofen-Famotidine (DUEXIS) 800-26.6 MG TABS, Take 1 tablet by mouth 3 (three) times daily as needed., Disp: 9 tablet, Rfl: 0 .  lamoTRIgine (LAMICTAL) 100 MG tablet, TAKE 1 TABLET BY MOUTH EVERY DAY, Disp: 90 tablet, Rfl: 0 .  simvastatin (ZOCOR) 40 MG tablet, Take 1 tablet (40 mg total) at bedtime by mouth., Disp: 90 tablet, Rfl: 3   Review of Systems:   ROS  Negative unless otherwise specified per HPI.   Vitals:   Vitals:   12/05/17 1132  BP: 128/82  Pulse: 60  Temp: 98.4 F (36.9 C)  TempSrc: Oral  SpO2: 97%  Weight: 244 lb 3.2 oz (110.8 kg)  Height: 5\' 8"  (1.727 m)     Body mass index is 37.13 kg/m.  Physical Exam:   Physical Exam  Constitutional: He appears well-developed. He  is cooperative.  Non-toxic appearance. He does not have a sickly appearance. He does not appear ill. No distress.  HENT:  Head: Normocephalic and atraumatic.  Right Ear: Tympanic membrane, external ear and ear canal normal. Tympanic membrane is not erythematous, not retracted and not bulging.  Left Ear: Tympanic membrane, external ear and ear canal normal. Tympanic membrane is not erythematous, not retracted and not bulging.  Nose: Mucosal edema and rhinorrhea present. Right sinus exhibits no maxillary sinus tenderness and no frontal sinus tenderness. Left sinus exhibits no maxillary sinus tenderness and no frontal sinus tenderness.  Mouth/Throat: Uvula is midline and mucous membranes are normal. Posterior oropharyngeal erythema present. No posterior oropharyngeal edema. Tonsils are 3+ on the right. Tonsils are 3+ on the left.  Tonsillar exudate.  Eyes: Conjunctivae and lids are normal.  Neck: Trachea normal.  Cardiovascular: Normal rate, regular rhythm, S1 normal, S2 normal and normal heart sounds.  Pulmonary/Chest: Effort normal and breath sounds normal. He has no decreased breath sounds. He has no wheezes. He has no rhonchi. He has no rales.  Lymphadenopathy:    He has no cervical adenopathy.  Neurological: He is alert.  Skin: Skin is warm, dry and intact.  Psychiatric: He has a normal mood and affect. His speech is normal and behavior is normal.  Nursing note and vitals reviewed.   Assessment and Plan:    Nazire was seen today for cough, nasal congestion and sore throat.  Diagnoses and all orders for this visit:  Tonsillitis No red flags on exam.  Will initiate Augmentin and Cheratussin per orders. Discussed taking medications as prescribed. Reviewed return precautions including worsening fever, SOB, worsening cough or other concerns. Push fluids and rest. I recommend that patient follow-up if symptoms worsen or persist despite treatment x 7-10 days, sooner if needed.   Other orders -     amoxicillin-clavulanate (AUGMENTIN) 875-125 MG tablet; Take 1 tablet by mouth 2 (two) times daily. -     guaiFENesin-codeine (ROBITUSSIN AC) 100-10 MG/5ML syrup; Take 5 mLs by mouth 3 (three) times daily as needed for cough.    . Reviewed expectations re: course of current medical issues. . Discussed self-management of symptoms. . Outlined signs and symptoms indicating need for more acute intervention. . Patient verbalized understanding and all questions were answered. . See orders for this visit as documented in the electronic medical record. . Patient received an After-Visit Summary.  Jarold Motto, PA-C

## 2017-12-05 NOTE — Patient Instructions (Signed)
It was great to see you!  Use medication as prescribed: Augmentin and Cheratussin Push fluids and get plenty of rest. Please return if you are not improving as expected, or if you have high fevers (>101.5) or difficulty swallowing or worsening productive cough.  Call clinic with questions.  I hope you start feeling better soon!

## 2017-12-20 ENCOUNTER — Ambulatory Visit: Payer: 59 | Admitting: Sports Medicine

## 2018-01-09 ENCOUNTER — Ambulatory Visit: Payer: 59 | Admitting: Family Medicine

## 2018-01-23 ENCOUNTER — Encounter: Payer: Self-pay | Admitting: Sports Medicine

## 2018-02-11 ENCOUNTER — Other Ambulatory Visit: Payer: Self-pay | Admitting: Family Medicine

## 2018-02-11 DIAGNOSIS — F3181 Bipolar II disorder: Secondary | ICD-10-CM

## 2018-02-12 ENCOUNTER — Ambulatory Visit (INDEPENDENT_AMBULATORY_CARE_PROVIDER_SITE_OTHER): Payer: 59

## 2018-02-12 ENCOUNTER — Ambulatory Visit (INDEPENDENT_AMBULATORY_CARE_PROVIDER_SITE_OTHER): Payer: 59 | Admitting: Family Medicine

## 2018-02-12 ENCOUNTER — Encounter: Payer: Self-pay | Admitting: Family Medicine

## 2018-02-12 VITALS — BP 130/80 | HR 72 | Temp 98.6°F | Ht 68.0 in | Wt 244.2 lb

## 2018-02-12 DIAGNOSIS — M25552 Pain in left hip: Secondary | ICD-10-CM | POA: Diagnosis not present

## 2018-02-12 DIAGNOSIS — F3181 Bipolar II disorder: Secondary | ICD-10-CM | POA: Diagnosis not present

## 2018-02-12 DIAGNOSIS — M47816 Spondylosis without myelopathy or radiculopathy, lumbar region: Secondary | ICD-10-CM | POA: Diagnosis not present

## 2018-02-12 DIAGNOSIS — M1611 Unilateral primary osteoarthritis, right hip: Secondary | ICD-10-CM | POA: Diagnosis not present

## 2018-02-12 MED ORDER — CYCLOBENZAPRINE HCL 5 MG PO TABS: 5.0000 mg | ORAL_TABLET | Freq: Three times a day (TID) | ORAL | 0 refills | Status: DC | PRN

## 2018-02-12 MED ORDER — HYDROCODONE-ACETAMINOPHEN 5-325 MG PO TABS: 1.0000 | ORAL_TABLET | Freq: Four times a day (QID) | ORAL | 0 refills | Status: DC | PRN

## 2018-02-12 MED ORDER — PHENTERMINE HCL 37.5 MG PO TABS: 37.5000 mg | ORAL_TABLET | Freq: Every day | ORAL | 1 refills | Status: DC

## 2018-02-12 MED ORDER — LAMOTRIGINE 100 MG PO TABS: 100.0000 mg | ORAL_TABLET | Freq: Every day | ORAL | 3 refills | Status: DC

## 2018-02-12 MED ORDER — FLUOXETINE HCL 20 MG PO CAPS: 40.0000 mg | ORAL_CAPSULE | Freq: Every day | ORAL | 6 refills | Status: DC

## 2018-02-12 NOTE — Progress Notes (Signed)
[facility] is a 57 y.o. male is here for follow up.  History of Present Illness:  Barnie Mort, CMA acting as scribe for Dr. [name].   ZOX:WRUEAVW in office for follow up. Started having hip pain about seven weeks ago after getting out of car one day. Pain makes it hard to sleep or work with out pain. He has appointment for orthopedics. He has not had any x ray on back or hip. Pain has increased to point he has left work early due to pain.  He never started simvastatin. States never received prescription.   Health Maintenance Due  Topic Date Due  . COLONOSCOPY  01/10/2011   Depression screen PHQ 2/9 02/12/2018 01/18/2017  Decreased Interest 0 0  Down, Depressed, Hopeless 0 0  PHQ - 2 Score 0 0   PMHx, SurgHx, SocialHx, FamHx, Medications, and Allergies were reviewed in the Visit Navigator and updated as appropriate.   Patient Active Problem List   Diagnosis Date Noted  . Mixed hyperlipidemia 07/22/2017  . Bipolar 2 disorder (HCC) 03/09/2017  . AC separation, left, initial encounter 01/23/2017  . Bilateral carpal tunnel syndrome 01/23/2017  . Chronic midline low back pain without sciatica 01/18/2017  . Trigger middle finger 01/18/2017  . Pain in both hands 01/18/2017  . Obesity (BMI 30.0-34.9) 01/18/2017  . Family history of diabetes mellitus type II 03/09/2016  . Family history of prostate cancer 03/09/2016  . GAD (generalized anxiety disorder) 03/09/2016   Social History   Tobacco Use  . Smoking status: Never Smoker  . Smokeless tobacco: Never Used  Substance Use Topics  . Alcohol use: No  . Drug use: No   Current Medications and Allergies:   Current Outpatient Medications:  .  FLUoxetine (PROZAC) 20 MG capsule, Take 2 capsules (40 mg total) by mouth daily., Disp: 60 capsule, Rfl: 6 .  gabapentin (NEURONTIN) 300 MG capsule, Take 1 capsule (300 mg total) by mouth at bedtime., Disp: 30 capsule, Rfl: 3 .  lamoTRIgine (LAMICTAL) 100 MG tablet, Take 1  tablet (100 mg total) by mouth daily., Disp: 90 tablet, Rfl: 3  No Known Allergies Review of Systems   Pertinent items are noted in the HPI. Otherwise, ROS is negative.  Vitals:   Vitals:   02/12/18 1042  BP: 130/80  Pulse: 72  Temp: 98.6 F (37 C)  TempSrc: Oral  SpO2: 96%  Weight: 244 lb 3.2 oz (110.8 kg)  Height:  (1.727 m)     Body mass index is 37.13 kg/m.  Physical Exam:   Physical Exam  Constitutional: He is oriented to person, place, and time. He appears well-developed and well-nourished. No distress.  HENT:  Head: Normocephalic and atraumatic.  Right Ear: External ear normal.  Left Ear: External ear normal.  Nose: Nose normal.  Mouth/Throat: Oropharynx is clear and moist.  Eyes: Pupils are equal, round, and reactive to light. Conjunctivae and EOM are normal.  Neck: Normal range of motion. Neck supple.  Cardiovascular: Normal rate, regular rhythm, normal heart sounds and intact distal pulses.  Pulmonary/Chest: Effort normal and breath sounds normal.  Abdominal: Soft. Bowel sounds are normal.  Musculoskeletal:       Lumbar back: He exhibits decreased range of motion and spasm.       Back:  Neurological: He is alert and oriented to person, place, and time.  Skin: Skin is warm and dry.  Psychiatric: He has a normal mood and affect. His behavior is normal. Judgment and thought  content normal.  Nursing note and vitals reviewed.  Assessment and Plan:   Cowan was seen today for follow-up and groin pain.  Diagnoses and all orders for this visit:  Pain of left hip joint Comments: Likely strain/spasm. Waiting for xray reads. Orders: -     DG Lumbar Spine 2-3 Views; Future -     DG HIP UNILAT W OR W/O PELVIS 2-3 VIEWS LEFT; Future -     cyclobenzaprine (FLEXERIL) 5 MG tablet; Take 1 tablet (5 mg total) by mouth 3 (three) times daily as needed for muscle spasms. -     HYDROcodone-acetaminophen (NORCO/VICODIN) 5-325 MG tablet; Take 1 tablet by mouth  every 6 (six) hours as needed for moderate pain.  Bipolar 2 disorder (HCC) Comments: Doing well. Needs refill of medications today. Continue current treatment. Orders: -     FLUoxetine (PROZAC) 20 MG capsule; Take 2 capsules (40 mg total) by mouth daily. -     lamoTRIgine (LAMICTAL) 100 MG tablet; Take 1 tablet (100 mg total) by mouth daily.  Morbid obesity (HCC) Comments: Patient having a difficult time controlling hunger. Wants to try medication for weight loss. Reviewed benefits v risks of medication below.  Orders: -     phentermine (ADIPEX-P) 37.5 MG tablet; Take 1 tablet (37.5 mg total) by mouth daily before breakfast.   . Reviewed expectations re: course of current medical issues. . Discussed self-management of symptoms. . Outlined signs and symptoms indicating need for more acute intervention. . Patient verbalized understanding and all questions were answered. Marland Kitchen Health Maintenance issues including appropriate healthy diet, exercise, and smoking avoidance were discussed with patient. . See orders for this visit as documented in the electronic medical record. . Patient received an After Visit Summary.  [name], DO [place], [facility] [place] [facility] 02/17/2018  Future Appointments  Date Time Provider Department Center  04/15/2018  3:40 PM [name], DO [facility]   CMA served as scribe during this visit. History, Physical, and Plan performed by medical provider. The above documentation has been reviewed and is accurate and complete. [name], D.O.  Records requested if needed. Time spent with the patient: 45 minutes, of which >50% was spent in obtaining information about his symptoms, reviewing his previous labs, evaluations, and treatments, counseling him about his condition (please see the discussed topics above), and developing a plan to further investigate it; he had a number of questions which I addressed.

## 2018-02-13 ENCOUNTER — Telehealth: Payer: Self-pay | Admitting: Family Medicine

## 2018-02-13 NOTE — Telephone Encounter (Signed)
Copied from CRM [id number]. Topic: Quick Communication - Other Results >> Feb 13, 2018  1:56 PM [facility], Rosey Bath D wrote: Patient would like a call back with his Lumbar spine and hip x-ray results. Please call patient back, thanks.

## 2018-02-13 NOTE — Telephone Encounter (Signed)
Please advise 

## 2018-02-13 NOTE — Telephone Encounter (Signed)
See note

## 2018-02-14 NOTE — Telephone Encounter (Signed)
Results given.

## 2018-02-14 NOTE — Telephone Encounter (Signed)
See result note.  

## 2018-02-17 ENCOUNTER — Encounter: Payer: Self-pay | Admitting: Family Medicine

## 2018-03-01 DIAGNOSIS — M1612 Unilateral primary osteoarthritis, left hip: Secondary | ICD-10-CM | POA: Diagnosis not present

## 2018-03-13 DIAGNOSIS — M1612 Unilateral primary osteoarthritis, left hip: Secondary | ICD-10-CM | POA: Diagnosis not present

## 2018-03-15 ENCOUNTER — Ambulatory Visit (INDEPENDENT_AMBULATORY_CARE_PROVIDER_SITE_OTHER): Payer: Self-pay | Admitting: Psychology

## 2018-03-15 DIAGNOSIS — F411 Generalized anxiety disorder: Secondary | ICD-10-CM

## 2018-04-11 ENCOUNTER — Ambulatory Visit: Payer: Self-pay | Admitting: Psychology

## 2018-04-11 DIAGNOSIS — F411 Generalized anxiety disorder: Secondary | ICD-10-CM

## 2018-04-14 DIAGNOSIS — M25579 Pain in unspecified ankle and joints of unspecified foot: Secondary | ICD-10-CM | POA: Insufficient documentation

## 2018-04-14 NOTE — Progress Notes (Deleted)
[facility] is a 57 y.o. male is here for follow up.  History of Present Illness:   HPI: Health Maintenance Due  Topic Date Due  . COLONOSCOPY  01/10/2011   Depression screen PHQ 2/9 02/12/2018 01/18/2017  Decreased Interest 0 0  Down, Depressed, Hopeless 0 0  PHQ - 2 Score 0 0   PMHx, SurgHx, SocialHx, FamHx, Medications, and Allergies were reviewed in the Visit Navigator and updated as appropriate.   Patient Active Problem List   Diagnosis Date Noted  . Pain in joint involving ankle and foot 04/14/2018  . Osteoarthritis of left hip 03/13/2018  . Mixed hyperlipidemia 07/22/2017  . Bipolar 2 disorder (HCC) 03/09/2017  . AC separation, left, initial encounter 01/23/2017  . Bilateral carpal tunnel syndrome 01/23/2017  . Chronic midline low back pain without sciatica 01/18/2017  . Trigger middle finger 01/18/2017  . Pain in both hands 01/18/2017  . Obesity (BMI 30.0-34.9) 01/18/2017  . Family history of diabetes mellitus type II 03/09/2016  . Family history of prostate cancer 03/09/2016  . GAD (generalized anxiety disorder) 03/09/2016   Social History   Tobacco Use  . Smoking status: Never Smoker  . Smokeless tobacco: Never Used  Substance Use Topics  . Alcohol use: No  . Drug use: No   Current Medications and Allergies:   Current Outpatient Medications:  .  cyclobenzaprine (FLEXERIL) 5 MG tablet, Take 1 tablet (5 mg total) by mouth 3 (three) times daily as needed for muscle spasms., Disp: 30 tablet, Rfl: 0 .  FLUoxetine (PROZAC) 20 MG capsule, Take 2 capsules (40 mg total) by mouth daily., Disp: 60 capsule, Rfl: 6 .  gabapentin (NEURONTIN) 300 MG capsule, Take 1 capsule (300 mg total) by mouth at bedtime., Disp: 30 capsule, Rfl: 3 .  HYDROcodone-acetaminophen (NORCO/VICODIN) 5-325 MG tablet, Take 1 tablet by mouth every 6 (six) hours as needed for moderate pain., Disp: 10 tablet, Rfl: 0 .  lamoTRIgine (LAMICTAL) 100 MG tablet, Take 1 tablet (100 mg total) by mouth  daily., Disp: 90 tablet, Rfl: 3 .  phentermine (ADIPEX-P) 37.5 MG tablet, Take 1 tablet (37.5 mg total) by mouth daily before breakfast., Disp: 30 tablet, Rfl: 1  No Known Allergies Review of Systems   Pertinent items are noted in the HPI. Otherwise, ROS is negative.  Vitals:  There were no vitals filed for this visit.   There is no height or weight on file to calculate BMI.  Physical Exam:   Physical Exam Results for orders placed or performed in visit on 07/11/17  CBC with Differential/Platelet  Result Value Ref Range   WBC 4.7 4.0 - 10.5 K/uL   RBC 4.74 4.22 - 5.81 Mil/uL   Hemoglobin 14.1 13.0 - 17.0 g/dL   HCT 16.141.7 09.639.0 - 04.552.0 %   MCV 88.0 78.0 - 100.0 fl   MCHC 33.7 30.0 - 36.0 g/dL   RDW 40.913.7 81.111.5 - 91.415.5 %   Platelets 292.0 150.0 - 400.0 K/uL   Neutrophils Relative % 58.2 43.0 - 77.0 %   Lymphocytes Relative 34.5 12.0 - 46.0 %   Monocytes Relative 5.1 3.0 - 12.0 %   Eosinophils Relative 1.6 0.0 - 5.0 %   Basophils Relative 0.6 0.0 - 3.0 %   Neutro Abs 2.8 1.4 - 7.7 K/uL   Lymphs Abs 1.6 0.7 - 4.0 K/uL   Monocytes Absolute 0.2 0.1 - 1.0 K/uL   Eosinophils Absolute 0.1 0.0 - 0.7 K/uL   Basophils Absolute 0.0 0.0 -  0.1 K/uL  Comprehensive metabolic panel  Result Value Ref Range   Sodium 139 135 - 145 mEq/L   Potassium 4.0 3.5 - 5.1 mEq/L   Chloride 107 96 - 112 mEq/L   CO2 24 19 - 32 mEq/L   Glucose, Bld 119 (H) 70 - 99 mg/dL   BUN 17 6 - 23 mg/dL   Creatinine, Ser 1.19 0.40 - 1.50 mg/dL   Total Bilirubin 0.4 0.2 - 1.2 mg/dL   Alkaline Phosphatase 75 39 - 117 U/L   AST 16 0 - 37 U/L   ALT 18 0 - 53 U/L   Total Protein 6.8 6.0 - 8.3 g/dL   Albumin 4.1 3.5 - 5.2 g/dL   Calcium 9.4 8.4 - 14.7 mg/dL   GFR 82.95 >62.13 mL/min  Lipid panel  Result Value Ref Range   Cholesterol 214 (H) 0 - 200 mg/dL   Triglycerides (H) 0.0 - 149.0 mg/dL    086.5 Triglyceride is over 400; calculations on Lipids are invalid.   HDL 27.40 (L) >39.00 mg/dL   Total CHOL/HDL Ratio 8     Hemoglobin A1c  Result Value Ref Range   Hgb A1c MFr Bld 5.9 4.6 - 6.5 %  LDL cholesterol, direct  Result Value Ref Range   Direct LDL 106.0 mg/dL  Hepatitis C antibody  Result Value Ref Range   Hepatitis C Ab NON-REACTIVE NON-REACTI   SIGNAL TO CUT-OFF 0.02 <1.00    Assessment and Plan:   There are no diagnoses linked to this encounter.  . Reviewed expectations re: course of current medical issues. . Discussed self-management of symptoms. . Outlined signs and symptoms indicating need for more acute intervention. . Patient verbalized understanding and all questions were answered. Marland Kitchen Health Maintenance issues including appropriate healthy diet, exercise, and smoking avoidance were discussed with patient. . See orders for this visit as documented in the electronic medical record. . Patient received an After Visit Summary.  [name], DO , [facility] 04/14/2018  Future Appointments  Date Time Provider Department Center  04/15/2018  3:40 PM [name], DO [facility]  04/25/2018  2:00 PM [facility], [place] [facility] None  05/01/2018 12:00 PM Shelor [facility]ona [place], [place] [facility] None  05/10/2018 12:00 PM Shelor [facility] [place], [place] [facility] None    *** CMA served as scribe during this visit. History, Physical, and Plan performed by medical provider. The above documentation has been reviewed and is accurate and complete. [name], D.O.

## 2018-04-15 ENCOUNTER — Ambulatory Visit: Payer: 59 | Admitting: Family Medicine

## 2018-04-15 DIAGNOSIS — Z0289 Encounter for other administrative examinations: Secondary | ICD-10-CM

## 2018-04-18 ENCOUNTER — Encounter: Payer: Self-pay | Admitting: Family Medicine

## 2018-04-25 ENCOUNTER — Ambulatory Visit: Payer: Self-pay | Admitting: Psychology

## 2018-05-01 ENCOUNTER — Ambulatory Visit: Payer: Self-pay | Admitting: Psychology

## 2018-05-05 ENCOUNTER — Encounter (HOSPITAL_COMMUNITY): Payer: Self-pay | Admitting: Emergency Medicine

## 2018-05-05 ENCOUNTER — Other Ambulatory Visit: Payer: Self-pay

## 2018-05-05 ENCOUNTER — Emergency Department (HOSPITAL_COMMUNITY)
Admission: EM | Admit: 2018-05-05 | Discharge: 2018-05-05 | Disposition: A | Discharge status: Home / Self Care | Payer: 59 | Attending: Emergency Medicine | Admitting: Emergency Medicine

## 2018-05-05 DIAGNOSIS — Z79899 Other long term (current) drug therapy: Secondary | ICD-10-CM | POA: Insufficient documentation

## 2018-05-05 DIAGNOSIS — R0602 Shortness of breath: Secondary | ICD-10-CM | POA: Diagnosis not present

## 2018-05-05 DIAGNOSIS — R0902 Hypoxemia: Secondary | ICD-10-CM | POA: Diagnosis not present

## 2018-05-05 DIAGNOSIS — R079 Chest pain, unspecified: Secondary | ICD-10-CM | POA: Insufficient documentation

## 2018-05-05 DIAGNOSIS — T7840XA Allergy, unspecified, initial encounter: Secondary | ICD-10-CM | POA: Diagnosis not present

## 2018-05-05 DIAGNOSIS — T63441A Toxic effect of venom of bees, accidental (unintentional), initial encounter: Secondary | ICD-10-CM | POA: Diagnosis not present

## 2018-05-05 DIAGNOSIS — T782XXA Anaphylactic shock, unspecified, initial encounter: Secondary | ICD-10-CM | POA: Diagnosis not present

## 2018-05-05 MED ORDER — PREDNISONE 20 MG PO TABS: 40.0000 mg | ORAL_TABLET | Freq: Every day | ORAL | 0 refills | Status: AC

## 2018-05-05 MED ORDER — FAMOTIDINE IN NACL 20-0.9 MG/50ML-% IV SOLN
20.0000 mg | Freq: Once | INTRAVENOUS | Status: AC
Start: 2018-05-05 — End: 2018-05-05
  Administered 2018-05-05: 20 mg via INTRAVENOUS
  Filled 2018-05-05: qty 50

## 2018-05-05 MED ORDER — ONDANSETRON 4 MG PO TBDP: 4.0000 mg | ORAL_TABLET | Freq: Three times a day (TID) | ORAL | 0 refills | Status: DC | PRN

## 2018-05-05 MED ORDER — EPINEPHRINE 0.3 MG/0.3ML IJ SOAJ: INTRAMUSCULAR | 1 refills | Status: DC

## 2018-05-05 MED ORDER — DIPHENHYDRAMINE HCL 50 MG/ML IJ SOLN
50.0000 mg | Freq: Once | INTRAMUSCULAR | Status: AC
Start: 2018-05-05 — End: 2018-05-05
  Administered 2018-05-05: 50 mg via INTRAVENOUS
  Filled 2018-05-05: qty 1

## 2018-05-05 NOTE — ED Provider Notes (Signed)
[facility] EMERGENCY DEPARTMENT Provider Note   CSN: [id number] Arrival date & time: 05/05/18  1636     History   Chief Complaint Chief Complaint  Patient presents with  . Allergic Reaction    HPI Kyle Haynes is a 57 y.o. male who presents today by EMS for evaluation of anaphylactic reaction.  Patient was stung approximately 4 times by yellow jackets and then rapidly became diaphoretic with shortness of breath, chest tightness, hives, facial swelling, and numbness to hands and lips.  His wife is an Charity fundraiser and gave him 50 mg of Benadryl, however then he started vomiting and having diarrhea.  They suspect that he vomited up the Benadryl.  He was given 0.3 IM of epi, 125 Solu-Medrol, 1 L normal saline by EMS.  Upon EMS arrival his blood pressure was 70/40 manually which improved after fluid bolus.  His nausea, vomiting, shortness of breath, chest tightness, and facial swelling has resolved.  He still reports itchy rash.  His sting sites include his toe, left anterior chest, and buttock.   HPI  Past Medical History:  Diagnosis Date  . Allergy   . Anxiety   . History of chicken pox     Patient Active Problem List   Diagnosis Date Noted  . Pain in joint involving ankle and foot 04/14/2018  . Osteoarthritis of left hip 03/13/2018  . Mixed hyperlipidemia 07/22/2017  . Bipolar 2 disorder (HCC) 03/09/2017  . AC separation, left, initial encounter 01/23/2017  . Bilateral carpal tunnel syndrome 01/23/2017  . Chronic midline low back pain without sciatica 01/18/2017  . Trigger middle finger 01/18/2017  . Pain in both hands 01/18/2017  . Obesity (BMI 30.0-34.9) 01/18/2017  . Family history of diabetes mellitus type II 03/09/2016  . Family history of prostate cancer 03/09/2016  . GAD (generalized anxiety disorder) 03/09/2016    Past Surgical History:  Procedure Laterality Date  . EYE SURGERY Left 1972   detached retina  . VASECTOMY  2010        Home  Medications    Prior to Admission medications   Medication Sig Start Date End Date Taking? Authorizing Provider  FLUoxetine (PROZAC) 20 MG capsule Take 2 capsules (40 mg total) by mouth daily. 02/12/18  Yes [name], DO  ibuprofen (ADVIL,MOTRIN) 200 MG tablet Take 400 mg by mouth every 6 (six) hours as needed for headache (pain).   Yes [provider]  lamoTRIgine (LAMICTAL) 100 MG tablet Take 1 tablet (100 mg total) by mouth daily. 02/12/18  Yes [name], DO  phentermine (ADIPEX-P) 37.5 MG tablet Take 1 tablet (37.5 mg total) by mouth daily before breakfast. 02/12/18  Yes [name], DO  simvastatin (ZOCOR) 40 MG tablet Take 40 mg by mouth daily.   Yes [provider]  cyclobenzaprine (FLEXERIL) 5 MG tablet Take 1 tablet (5 mg total) by mouth 3 (three) times daily as needed for muscle spasms. Patient not taking: Reported on 05/05/2018 02/12/18   [name], DO  EPINEPHrine 0.3 mg/0.3 mL IJ SOAJ injection Inject one device into the side of the thigh for severe allergic reactions.  Repeat with second device in 5-10 minutes if needed, call 9-11 05/05/18   Cristina Gong, PA-C  gabapentin (NEURONTIN) 300 MG capsule Take 1 capsule (300 mg total) by mouth at bedtime. Patient not taking: Reported on 05/05/2018 02/05/17   Andrena Mews, DO  HYDROcodone-acetaminophen (NORCO/VICODIN) 5-325 MG tablet Take 1 tablet by mouth every 6 (six) hours as  needed for moderate pain. Patient not taking: Reported on 05/05/2018 02/12/18   [name], [name], DO  ondansetron (ZOFRAN ODT) 4 MG disintegrating tablet Take 1 tablet (4 mg total) by mouth every 8 (eight) hours as needed for nausea or vomiting. 05/05/18   Cristina GongHammond, Ileana Chalupa W, PA-C  predniSONE (DELTASONE) 20 MG tablet Take 2 tablets (40 mg total) by mouth daily for 2 days. 05/05/18 05/07/18  Cristina GongHammond, Airyanna Dipalma W, PA-C    Family History History reviewed. No pertinent family history.  Social History Social History   Tobacco Use    . Smoking status: Never Smoker  . Smokeless tobacco: Never Used  Substance Use Topics  . Alcohol use: No  . Drug use: No     Allergies   Yellow jacket venom [bee venom]   Review of Systems Review of Systems  Constitutional: Negative for chills and fever.  HENT: Negative for congestion.   Eyes: Negative for visual disturbance.  Respiratory: Positive for chest tightness and shortness of breath.   Cardiovascular: Positive for chest pain.  Gastrointestinal: Positive for abdominal pain, diarrhea, nausea and vomiting.  Skin: Positive for color change and rash.  Neurological: Negative for weakness and headaches.  Psychiatric/Behavioral: Negative for confusion.  All other systems reviewed and are negative.  Chest tightness, shortness of breath, chest pain, abdominal pain, N/v/D, have all resolved  Physical Exam Updated Vital Signs BP 105/61   Pulse 63   Temp 97.7 F (36.5 C) (Oral)   Resp 11   SpO2 98%   Physical Exam  Constitutional: He is oriented to person, place, and time. He appears well-developed and well-nourished.  HENT:  Head: Normocephalic and atraumatic.  Mouth/Throat: Oropharynx is clear and moist.  Eyes: Conjunctivae are normal.  Neck: Normal range of motion. Neck supple.  Cardiovascular: Normal rate, regular rhythm, normal heart sounds and intact distal pulses.  No murmur heard. Pulmonary/Chest: Effort normal and breath sounds normal. No stridor. No respiratory distress. He has no wheezes.  Abdominal: Soft. Bowel sounds are normal. He exhibits no distension and no mass. There is no tenderness. There is no guarding.  Musculoskeletal: He exhibits no edema.  Neurological: He is alert and oriented to person, place, and time.  Skin: Skin is warm and dry. He is not diaphoretic.  Diffuse urticarial rash present over chest, neck, back, abdomen and extremities.   Psychiatric: He has a normal mood and affect. His behavior is normal.  Nursing note and vitals  reviewed.    ED Treatments / Results  Labs (all labs ordered are listed, but only abnormal results are displayed) Labs Reviewed - No data to display  EKG EKG Interpretation  Date/Time:  Sunday May 05 2018 16:43:16 EDT Ventricular Rate:  59 PR Interval:    QRS Duration: 110 QT Interval:  459 QTC Calculation: 455 R Axis:   48 Text Interpretation:  Sinus rhythm RSR' in V1 or V2, probably normal variant Minimal ST elevation, anterior leads No prior ECG for evaluiation.  No STEMI Confirmed by Theda Belfastegeler, Chris ([id number]) on 05/05/2018 5:09:06 PM   Radiology No results found.  Procedures Procedures (including critical care time)  Medications Ordered in ED Medications  diphenhydrAMINE (BENADRYL) injection 50 mg (50 mg Intravenous Given 05/05/18 1707)  famotidine (PEPCID) IVPB 20 mg premix ( Intravenous Stopped 05/05/18 1746)     Initial Impression / Assessment and Plan / ED Course  I have reviewed the triage vital signs and the nursing notes.  Pertinent labs & imaging results that were available during my care  of the patient were reviewed by me and considered in my medical decision making (see chart for details).  Clinical Course as of May 06 32  Wynelle LinkSun May 05, 2018  2213 Patient reevaluated, his rash is fully resolved, he denies any symptoms at this time and states he is ready to go home.  Discussed at length with patient and his wife cold return precautions, along with EpiPen uses and they stated their understanding.   [EH]    Clinical Course User Index [EH] Cristina GongHammond, Silvia Markuson W, PA-C   Presents today for evaluation of anaphylactic reaction.  He was given Benadryl by his wife prior to EMS arrival, however that was p.o. when he vomited multiple times minutes after.  EMS gave him IV fluids, Solu-Medrol, and epi after which his blood pressure improved and he began feeling better.  He was given 50 mg of Benadryl IV, along with Pepcid IV which fully resolved his hives.  He was observed  in the emergency room for a full 6 hours after epinephrine administration without evidence of recurrent reaction.  He was discharged with prescription for EpiPen's, 2 days of prednisone, along with instructions on taking Benadryl.  Return precautions were discussed with patient who states their understanding.  At the time of discharge patient denied any unaddressed complaints or concerns.  Patient is agreeable for discharge home.   Final Clinical Impressions(s) / ED Diagnoses   Final diagnoses:  Anaphylaxis, initial encounter  Bee sting reaction, accidental or unintentional, initial encounter    ED Discharge Orders         Ordered    EPINEPHrine 0.3 mg/0.3 mL IJ SOAJ injection     05/05/18 2217    predniSONE (DELTASONE) 20 MG tablet  Daily     05/05/18 2217    ondansetron (ZOFRAN ODT) 4 MG disintegrating tablet  Every 8 hours PRN     05/05/18 2217           Cristina GongHammond, Ameir Faria W, PA-C 05/06/18 0033    Tegeler, Canary Brimhristopher J, MD 05/06/18 1135

## 2018-05-05 NOTE — ED Triage Notes (Signed)
Per [facility], Pt was stung approximately 4 times by yellow jackets. Pt became diaphoretic, SHOB, hives to chest, numbness to hands and lips. Pt took 50 mg of benadryl at home. Pt received 0.3 of epi, 125 mg of solu medrol, and 1 L of NS. Pt's initial BP 70/40. Repeat 120 BP (p). Pt alert, in no acute distress at this time. Pt was stung by yellow jackets approximately a month ago with only a local reaction.

## 2018-05-05 NOTE — Discharge Instructions (Addendum)
Today you were seen and evaluated for an allergic reaction.  You were given multiple medications in the emergency room including IV benadryl (which may make you sleepy) and IV steroids.    If you develop any concerning symptoms, especially feeling like your throat is closing, shortness of breath, swelling of the face, lips or tongue then please return to the emergency room immediately for evaluation.    For the next 24 hours please take 1 pill (25 mg) of benadryl every 6 hours.  If you have mild symptoms such as itching, mild nausea you may take a second benadryl.  After 24 hours please start taking zyrtec if you still have symptoms or mild symptoms.  While the box tells you to take one pill every 24 hours I want you to take one pill every 12 hours for the next few days.  This will make you sleepy and you should not drive, operate heavy machinery or perform any task where if you were to fall asleep or make a slow decision it could cause harm to you or someone else.   You have also been given a prescription for 5 days of steroids.  Please take your first dose about 24 hours after you were given steroids in the emergency room.  Steroids may give you the feeling of being hot, increased appetite, and extra energy.    I have given you a prescription for two epi pens.  Please make sure you keep one with you at all times.  Please consider keeping two pills of benadryl with your epi pen and if you use it take the benadryl and call 9-11.  Please do not store epi-pens in your car or allow them to get very hot or cold as this can make them not work as well.

## 2018-05-08 ENCOUNTER — Encounter: Payer: Self-pay | Admitting: Family Medicine

## 2018-05-08 ENCOUNTER — Ambulatory Visit: Payer: 59 | Admitting: Family Medicine

## 2018-05-08 VITALS — BP 116/78 | HR 58 | Temp 98.0°F | Ht 68.0 in | Wt 222.0 lb

## 2018-05-08 DIAGNOSIS — Z87892 Personal history of anaphylaxis: Secondary | ICD-10-CM

## 2018-05-08 MED ORDER — PHENTERMINE HCL 37.5 MG PO TABS: 37.5000 mg | ORAL_TABLET | Freq: Every day | ORAL | 2 refills | Status: DC

## 2018-05-08 NOTE — Progress Notes (Signed)
Kyle Haynes is a 57 y.o. male is here for follow up.  History of Present Illness:   HPI: ED visit for anaphylaxis from wasp sting. Note and treatment reviewed. Feeling better. Has Epipen.   Continues to lose weight. Phentermine helps. Make healthy food choices and exercises regularly.   Health Maintenance Due  Topic Date Due  . COLONOSCOPY  01/10/2011   Depression screen PHQ 2/9 02/12/2018 01/18/2017  Decreased Interest 0 0  Down, Depressed, Hopeless 0 0  PHQ - 2 Score 0 0   PMHx, SurgHx, SocialHx, FamHx, Medications, and Allergies were reviewed in the Visit Navigator and updated as appropriate.   Patient Active Problem List   Diagnosis Date Noted  . Pain in joint involving ankle and foot 04/14/2018  . Osteoarthritis of left hip 03/13/2018  . Mixed hyperlipidemia 07/22/2017  . Bipolar 2 disorder (HCC) 03/09/2017  . AC separation, left, initial encounter 01/23/2017  . Bilateral carpal tunnel syndrome 01/23/2017  . Chronic midline low back pain without sciatica 01/18/2017  . Trigger middle finger 01/18/2017  . Pain in both hands 01/18/2017  . Obesity (BMI 30.0-34.9) 01/18/2017  . Family history of diabetes mellitus type II 03/09/2016  . Family history of prostate cancer 03/09/2016  . GAD (generalized anxiety disorder) 03/09/2016   Social History   Tobacco Use  . Smoking status: Never Smoker  . Smokeless tobacco: Never Used  Substance Use Topics  . Alcohol use: No  . Drug use: No   Current Medications and Allergies:   .  EPINEPHrine 0.3 mg/0.3 mL IJ SOAJ injection, Inject one device into the side of the thigh for severe allergic reactions.  Repeat with second device in 5-10 minutes if needed, call 9-11, Disp: 2 Device, Rfl: 1 .  FLUoxetine (PROZAC) 20 MG capsule, Take 2 capsules (40 mg total) by mouth daily., Disp: 60 capsule, Rfl: 6 .  lamoTRIgine (LAMICTAL) 100 MG tablet, Take 1 tablet (100 mg total) by mouth daily., Disp: 90 tablet, Rfl: 3 .  phentermine  (ADIPEX-P) 37.5 MG tablet, Take 1 tablet (37.5 mg total) by mouth daily before breakfast., Disp: 30 tablet, Rfl: 2 .  simvastatin (ZOCOR) 40 MG tablet, Take 40 mg by mouth daily., Disp: , Rfl:    Allergies  Allergen Reactions  . Yellow Jacket Venom [Bee Venom] Hives and Shortness Of Breath   Review of Systems   Pertinent items are noted in the HPI. Otherwise, ROS is negative.  Vitals:   Vitals:   05/08/18 1439  BP: 116/78  Pulse: (!) 58  Temp: 98 F (36.7 C)  TempSrc: Oral  SpO2: 96%  Weight: 222 lb (100.7 kg)  Height: 5\' 8"  (1.727 m)     Body mass index is 33.75 kg/m.  Physical Exam:   Physical Exam  Constitutional: He is oriented to person, place, and time. He appears well-developed and well-nourished. No distress.  HENT:  Head: Normocephalic and atraumatic.  Right Ear: External ear normal.  Left Ear: External ear normal.  Nose: Nose normal.  Mouth/Throat: Oropharynx is clear and moist.  Eyes: Pupils are equal, round, and reactive to light. Conjunctivae and EOM are normal.  Neck: Normal range of motion. Neck supple.  Cardiovascular: Normal rate, regular rhythm, normal heart sounds and intact distal pulses.  Pulmonary/Chest: Effort normal and breath sounds normal.  Abdominal: Soft. Bowel sounds are normal.  Musculoskeletal: Normal range of motion.  Neurological: He is alert and oriented to person, place, and time.  Skin: Skin is warm and dry.  Psychiatric: He has a normal mood and affect. His behavior is normal. Judgment and thought content normal.  Nursing note and vitals reviewed.   Results for orders placed or performed in visit on 07/11/17  CBC with Differential/Platelet  Result Value Ref Range   WBC 4.7 4.0 - 10.5 K/uL   RBC 4.74 4.22 - 5.81 Mil/uL   Hemoglobin 14.1 13.0 - 17.0 g/dL   HCT 16.141.7 09.639.0 - 04.552.0 %   MCV 88.0 78.0 - 100.0 fl   MCHC 33.7 30.0 - 36.0 g/dL   RDW 40.913.7 81.111.5 - 91.415.5 %   Platelets 292.0 150.0 - 400.0 K/uL   Neutrophils Relative %  58.2 43.0 - 77.0 %   Lymphocytes Relative 34.5 12.0 - 46.0 %   Monocytes Relative 5.1 3.0 - 12.0 %   Eosinophils Relative 1.6 0.0 - 5.0 %   Basophils Relative 0.6 0.0 - 3.0 %   Neutro Abs 2.8 1.4 - 7.7 K/uL   Lymphs Abs 1.6 0.7 - 4.0 K/uL   Monocytes Absolute 0.2 0.1 - 1.0 K/uL   Eosinophils Absolute 0.1 0.0 - 0.7 K/uL   Basophils Absolute 0.0 0.0 - 0.1 K/uL  Comprehensive metabolic panel  Result Value Ref Range   Sodium 139 135 - 145 mEq/L   Potassium 4.0 3.5 - 5.1 mEq/L   Chloride 107 96 - 112 mEq/L   CO2 24 19 - 32 mEq/L   Glucose, Bld 119 (H) 70 - 99 mg/dL   BUN 17 6 - 23 mg/dL   Creatinine, Ser 7.820.86 0.40 - 1.50 mg/dL   Total Bilirubin 0.4 0.2 - 1.2 mg/dL   Alkaline Phosphatase 75 39 - 117 U/L   AST 16 0 - 37 U/L   ALT 18 0 - 53 U/L   Total Protein 6.8 6.0 - 8.3 g/dL   Albumin 4.1 3.5 - 5.2 g/dL   Calcium 9.4 8.4 - 95.610.5 mg/dL   GFR 21.3097.60 >86.57>60.00 mL/min  Lipid panel  Result Value Ref Range   Cholesterol 214 (H) 0 - 200 mg/dL   Triglycerides (H) 0.0 - 149.0 mg/dL    846.9447.0 Triglyceride is over 400; calculations on Lipids are invalid.   HDL 27.40 (L) >39.00 mg/dL   Total CHOL/HDL Ratio 8   Hemoglobin A1c  Result Value Ref Range   Hgb A1c MFr Bld 5.9 4.6 - 6.5 %  LDL cholesterol, direct  Result Value Ref Range   Direct LDL 106.0 mg/dL  Hepatitis C antibody  Result Value Ref Range   Hepatitis C Ab NON-REACTIVE NON-REACTI   SIGNAL TO CUT-OFF 0.02 <1.00    Assessment and Plan:   Kyle Haynes was seen today for follow-up.  Diagnoses and all orders for this visit:  History of anaphylaxis -     Ambulatory referral to Allergy  Morbid obesity (HCC) Comments: Doing well. Continue current treatment.  Orders: -     phentermine (ADIPEX-P) 37.5 MG tablet; Take 1 tablet (37.5 mg total) by mouth daily before breakfast.    . Reviewed expectations re: course of current medical issues. . Discussed self-management of symptoms. . Outlined signs and symptoms indicating need for  more acute intervention. . Patient verbalized understanding and all questions were answered. Marland Kitchen. Health Maintenance issues including appropriate healthy diet, exercise, and smoking avoidance were discussed with patient. . See orders for this visit as documented in the electronic medical record. . Patient received an After Visit Summary.   Helane RimaErica Halynn Reitano, DO [place], [facility] 05/12/2018

## 2018-05-10 ENCOUNTER — Ambulatory Visit: Payer: Self-pay | Admitting: Psychology

## 2018-05-12 ENCOUNTER — Encounter: Payer: Self-pay | Admitting: Family Medicine

## 2018-05-22 ENCOUNTER — Telehealth: Payer: Self-pay | Admitting: Family Medicine

## 2018-05-22 NOTE — Telephone Encounter (Signed)
Please advise 

## 2018-05-22 NOTE — Telephone Encounter (Signed)
Copied from CRM [id number]. Topic: Quick Communication - Rx Refill/Question >> May 22, 2018  8:42 AM Gloriann Loan L wrote: Medication: phentermine (ADIPEX-P) 37.5 MG tablet  pt states that he had misplaced RX and would like to get another one and send it to the pharmacy this time   Has the patient contacted their pharmacy? No. Because RX was printed out and given to him at office (Agent: If no, request that the patient contact the pharmacy for the refill.) (Agent: If yes, when and what did the pharmacy advise?)  Preferred Pharmacy (with phone number or street name):     [id number] - [place], [place] - [address] AT [address]OSS [address] [phone] (Phone) [phone] (Fax)    Agent: Please be advised that RX refills may take up to 3 business days. We ask that you follow-up with your pharmacy.

## 2018-05-23 NOTE — Telephone Encounter (Signed)
Unfortunately, controlled substance cannot be sent in early. Okay to refill when due.

## 2018-05-24 NOTE — Telephone Encounter (Signed)
Left message for patient on voicemail that we would not be able to send in the prescription for the Phentermine due to being controlled substance. Left phone number for further questions.

## 2018-06-11 ENCOUNTER — Ambulatory Visit: Payer: 59 | Admitting: Allergy and Immunology

## 2018-06-11 ENCOUNTER — Encounter: Payer: Self-pay | Admitting: Allergy and Immunology

## 2018-06-11 VITALS — BP 120/82 | HR 72 | Temp 98.1°F | Resp 20

## 2018-06-11 DIAGNOSIS — T63481D Toxic effect of venom of other arthropod, accidental (unintentional), subsequent encounter: Secondary | ICD-10-CM

## 2018-06-11 DIAGNOSIS — T782XXD Anaphylactic shock, unspecified, subsequent encounter: Secondary | ICD-10-CM

## 2018-06-11 NOTE — Progress Notes (Signed)
Dear Dr. Earlene Plater,  Thank you for referring Zollie Scale to the [facility] [place] on 06/11/2018.   Below is a summation of this patient's evaluation and recommendations.  Thank you for your referral. I will keep you informed about this patient's response to treatment.   If you have any questions please do not hesitate to contact me.   Sincerely,  Jessica Priest, MD Allergy / Immunology [facility]   ______________________________________________________________________    NEW PATIENT NOTE  Referring Provider: [name], DO Primary Provider: [name], DO Date of office visit: 06/11/2018    Subjective:   Chief Complaint:  Kyle Haynes (DOB: Aug 09, 1961) is a 57 y.o. male who presents to the clinic on 06/11/2018 with a chief complaint of Insect Bite .     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  Apparently on 05 May 2018 he was stung by yellow jackets with approximately 4 stings and developed hives, facial swelling, shortness of breath, numbness of his hands and lips, and dizziness within 15 minutes of sting.  He consumed 50 mg of Benadryl but then vomited and had diarrhea and was evaluated by EMS at which point he had a low blood pressure of 70/40 for which he was treated with fluid bolus, epinephrine, steroids.  Over the course of several hours his nausea and vomiting shortness of breath and facial swelling resolved as he was observed in the emergency room.  It should be noted that approximately 1 month prior to this reaction he was stung by yellow jackets with approximately 8 stings and only developed large local reactions.  Past Medical History:  Diagnosis Date  . Allergic reaction to insect sting   . Allergy   . Angio-edema   . Anxiety   . History of chicken pox   . Urticaria     Past Surgical History:  Procedure  Laterality Date  . EYE SURGERY Left 1972   detached retina  . VASECTOMY  2010    Allergies as of 06/11/2018      Reactions   Yellow Jacket Venom [bee Venom] Hives, Shortness Of Breath      Medication List      EPINEPHrine 0.3 mg/0.3 mL Soaj injection Commonly known as:  EPI-PEN Inject one device into the side of the thigh for severe allergic reactions.  Repeat with second device in 5-10 minutes if needed, call 9-11   FLUoxetine 20 MG capsule Commonly known as:  PROZAC Take 2 capsules (40 mg total) by mouth daily.   ibuprofen 200 MG tablet Commonly known as:  ADVIL,MOTRIN Take 400 mg by mouth every 6 (six) hours as needed for headache (pain).   lamoTRIgine 100 MG tablet Commonly known as:  LAMICTAL Take 1 tablet (100 mg total) by mouth daily.   simvastatin 40 MG tablet Commonly known as:  ZOCOR Take 40 mg by mouth daily.       Review of systems negative except as noted in HPI / PMHx or noted below:  Review of Systems  Constitutional: Negative.   HENT: Negative.   Eyes: Negative.   Respiratory: Negative.   Cardiovascular: Negative.   Gastrointestinal: Negative.   Genitourinary: Negative.   Musculoskeletal: Negative.   Skin: Negative.   Neurological: Negative.   Endo/Heme/Allergies: Negative.   Psychiatric/Behavioral: Negative.     Family History  Problem Relation Age of Onset  . Diabetes Mother   .  Prostate cancer Father     Social History   Socioeconomic History  . Marital status: Married    Spouse name: Not on file  . Number of children: Not on file  . Years of education: Not on file  . Highest education level: Not on file  Occupational History  . Not on file  Social Needs  . Financial resource strain: Not on file  . Food insecurity:    Worry: Not on file    Inability: Not on file  . Transportation needs:    Medical: Not on file    Non-medical: Not on file  Tobacco Use  . Smoking status: Never Smoker  . Smokeless tobacco: Never Used    Substance and Sexual Activity  . Alcohol use: No  . Drug use: No  . Sexual activity: Yes  Lifestyle  . Physical activity:    Days per week: Not on file    Minutes per session: Not on file  . Stress: Not on file  Relationships  . Social connections:    Talks on phone: Not on file    Gets together: Not on file    Attends religious service: Not on file    Active member of club or organization: Not on file    Attends meetings of clubs or organizations: Not on file    Relationship status: Not on file  . Intimate partner violence:    Fear of current or ex partner: Not on file    Emotionally abused: Not on file    Physically abused: Not on file    Forced sexual activity: Not on file  Other Topics Concern  . Not on file  Social History Narrative  . Not on file    Environmental and Social history  Lives in a house with a dry environment, a dog and a cat located inside the household, no carpet in the bedroom, no plastic on the bed, no plastic on the pillow, and no smokers located inside the household.  He is a Passenger transport managermeter tech.  Objective:   Vitals:   06/11/18 1446  BP: 120/82  Pulse: 72  Resp: 20  Temp: 98.1 F (36.7 C)  SpO2: 95%        Physical Exam  HENT:  Head: Normocephalic. Head is without right periorbital erythema and without left periorbital erythema.  Right Ear: Tympanic membrane, external ear and ear canal normal.  Left Ear: Tympanic membrane, external ear and ear canal normal.  Nose: Nose normal. No mucosal edema or rhinorrhea.  Mouth/Throat: Oropharynx is clear and moist and mucous membranes are normal. No oropharyngeal exudate.  Eyes: Pupils are equal, round, and reactive to light. Conjunctivae and lids are normal.  Neck: Trachea normal. No tracheal deviation present. No thyromegaly present.  Cardiovascular: Normal rate, regular rhythm, S1 normal, S2 normal and normal heart sounds.  No murmur heard. Pulmonary/Chest: Effort normal. No stridor. No respiratory  distress. He has no wheezes. He has no rales. He exhibits no tenderness.  Abdominal: Soft. He exhibits no distension and no mass. There is no hepatosplenomegaly. There is no tenderness. There is no rebound and no guarding.  Musculoskeletal: He exhibits no edema or tenderness.  Lymphadenopathy:       Head (right side): No tonsillar adenopathy present.       Head (left side): No tonsillar adenopathy present.    He has no cervical adenopathy.    He has no axillary adenopathy.  Neurological: He is alert.  Skin: No rash noted. He  is not diaphoretic. No erythema. No pallor. Nails show no clubbing.    Diagnostics: Allergy skin tests were not performed.   Assessment and Plan:    1. Anaphylaxis due to hymenoptera venom, accidental or unintentional, subsequent encounter     1.  Allergen avoidance measures  2.  During rest of "flying insect season" use the following:   A.  Ranitidine 150 mg tablet twice a day  B.  Loratadine 10 mg tablet twice a day  3.  Auvi-Q, Benadryl, MD/ER evaluation for allergic reaction  4.  Blood -hymenoptera venom panel  5.  Immunotherapy?  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  For the rest of this "flying insects season" he will utilize an H1 and H2 receptor blocker on a consistent basis in the hope of modifying a allergic reaction that may occur following inadvertent exposure to hymenoptera venom.  We will check his hymenoptera IgE antibodies and make a decision about starting immunotherapy based upon the results of those tests.  I will contact him with these results once they are available for review.  Jessica Priest, MD Allergy / Immunology [facility] [place]

## 2018-06-11 NOTE — Patient Instructions (Addendum)
  1.  Allergen avoidance measures  2.  During rest of "flying insect season" use the following:   A.  Ranitidine 150 mg tablet twice a day  B.  Loratadine 10 mg tablet twice a day  3.  Auvi-Q, Benadryl, MD/ER evaluation for allergic reaction  4.  Blood -hymenoptera venom panel  5.  Immunotherapy?

## 2018-06-12 ENCOUNTER — Encounter: Payer: Self-pay | Admitting: Allergy and Immunology

## 2018-06-12 MED ORDER — EPINEPHRINE 0.3 MG/0.3ML IJ SOAJ: 0.3000 mg | Freq: Once | INTRAMUSCULAR | 1 refills | Status: AC

## 2018-06-14 LAB — ALLERGEN HYMENOPTERA PANEL
Hornet, Yellow, IgE: 2.99 kU/L — AB
I002-IGE HORNET, WHITE FACE: 3.74 kU/L — AB
I205-IGE BUMBLEBEE: 0.18 kU/L — AB
IGE HONEYBEE: 0.43 kU/L — AB
Paper Wasp IgE: 2.84 kU/L — AB
Yellow Jacket, IgE: 22.6 kU/L — AB

## 2018-06-23 DIAGNOSIS — Z23 Encounter for immunization: Secondary | ICD-10-CM | POA: Diagnosis not present

## 2018-08-02 ENCOUNTER — Other Ambulatory Visit: Payer: Self-pay | Admitting: Family Medicine

## 2018-08-19 ENCOUNTER — Other Ambulatory Visit (INDEPENDENT_AMBULATORY_CARE_PROVIDER_SITE_OTHER): Payer: 59

## 2018-08-19 ENCOUNTER — Other Ambulatory Visit: Payer: Self-pay | Admitting: Physician Assistant

## 2018-08-19 ENCOUNTER — Ambulatory Visit: Payer: 59 | Admitting: Physician Assistant

## 2018-08-19 ENCOUNTER — Encounter: Payer: Self-pay | Admitting: Physician Assistant

## 2018-08-19 ENCOUNTER — Ambulatory Visit (INDEPENDENT_AMBULATORY_CARE_PROVIDER_SITE_OTHER)
Admission: RE | Admit: 2018-08-19 | Discharge: 2018-08-19 | Disposition: A | Discharge status: Home / Self Care | Payer: 59 | Source: Ambulatory Visit | Attending: Physician Assistant | Admitting: Physician Assistant

## 2018-08-19 VITALS — BP 130/70 | HR 63 | Temp 98.6°F | Ht 68.0 in | Wt 227.4 lb

## 2018-08-19 DIAGNOSIS — R7309 Other abnormal glucose: Secondary | ICD-10-CM | POA: Diagnosis not present

## 2018-08-19 DIAGNOSIS — Q248 Other specified congenital malformations of heart: Secondary | ICD-10-CM | POA: Insufficient documentation

## 2018-08-19 DIAGNOSIS — R1084 Generalized abdominal pain: Secondary | ICD-10-CM

## 2018-08-19 DIAGNOSIS — K5730 Diverticulosis of large intestine without perforation or abscess without bleeding: Secondary | ICD-10-CM | POA: Diagnosis not present

## 2018-08-19 LAB — COMPREHENSIVE METABOLIC PANEL
ALT: 18 U/L (ref 0–53)
AST: 17 U/L (ref 0–37)
Albumin: 4.4 g/dL (ref 3.5–5.2)
Alkaline Phosphatase: 67 U/L (ref 39–117)
BUN: 11 mg/dL (ref 6–23)
CHLORIDE: 100 meq/L (ref 96–112)
CO2: 28 meq/L (ref 19–32)
CREATININE: 0.92 mg/dL (ref 0.40–1.50)
Calcium: 9.9 mg/dL (ref 8.4–10.5)
GFR: 89.93 mL/min (ref >60.00)
GLUCOSE: 121 mg/dL — AB (ref 70–99)
POTASSIUM: 4.2 meq/L (ref 3.5–5.1)
SODIUM: 137 meq/L (ref 135–145)
Total Bilirubin: 0.7 mg/dL (ref 0.2–1.2)
Total Protein: 7.8 g/dL (ref 6.0–8.3)

## 2018-08-19 LAB — CBC WITH DIFFERENTIAL/PLATELET
BASOS PCT: 0.5 % (ref 0.0–3.0)
Basophils Absolute: 0.1 10*3/uL (ref 0.0–0.1)
EOS ABS: 0.1 10*3/uL (ref 0.0–0.7)
Eosinophils Relative: 0.8 % (ref 0.0–5.0)
HCT: 45.3 % (ref 39.0–52.0)
HEMOGLOBIN: 15.2 g/dL (ref 13.0–17.0)
LYMPHS ABS: 2.1 10*3/uL (ref 0.7–4.0)
Lymphocytes Relative: 16.4 % (ref 12.0–46.0)
MCHC: 33.6 g/dL (ref 30.0–36.0)
MCV: 86.9 fl (ref 78.0–100.0)
MONO ABS: 0.8 10*3/uL (ref 0.1–1.0)
Monocytes Relative: 6.5 % (ref 3.0–12.0)
NEUTROS PCT: 75.8 % (ref 43.0–77.0)
Neutro Abs: 9.7 10*3/uL — ABNORMAL HIGH (ref 1.4–7.7)
PLATELETS: 321 10*3/uL (ref 150.0–400.0)
RBC: 5.21 Mil/uL (ref 4.22–5.81)
RDW: 14.2 % (ref 11.5–15.5)
WBC: 12.8 10*3/uL — AB (ref 4.0–10.5)

## 2018-08-19 LAB — HEMOGLOBIN A1C: HEMOGLOBIN A1C: 6 % (ref 4.6–6.5)

## 2018-08-19 MED ORDER — METRONIDAZOLE 500 MG PO TABS: 500.0000 mg | ORAL_TABLET | Freq: Three times a day (TID) | ORAL | 0 refills | Status: DC

## 2018-08-19 MED ORDER — CIPROFLOXACIN HCL 500 MG PO TABS: 500.0000 mg | ORAL_TABLET | Freq: Two times a day (BID) | ORAL | 0 refills | Status: AC

## 2018-08-19 MED ORDER — IOPAMIDOL (ISOVUE-300) INJECTION 61%
100.0000 mL | Freq: Once | INTRAVENOUS | Status: AC | PRN
  Administered 2018-08-19: 100 mL via INTRAVENOUS

## 2018-08-19 NOTE — Progress Notes (Signed)
Kyle Haynes is a 57 y.o. male here for a new problem.  I acted as a Neurosurgeon for [employer], PA-C Corky Mull, LPN  History of Present Illness:   Chief Complaint  Patient presents with  . Abdominal Pain   Similar episode happened 1 month ago. He also endorses another episode about two years ago while traveling and was given morphine and gas-x, he was seen in the ER but is unable to to remember what diagnosis he was given.  Abdominal Pain  This is a recurrent problem. Episode onset: Started on Saturday. The onset quality is sudden. The problem occurs constantly. The problem has been waxing and waning. The pain is located in the generalized abdominal region. The pain is at a severity of 7/10. The pain is moderate. The quality of the pain is cramping, a sensation of fullness and sharp. The abdominal pain does not radiate. Associated symptoms include belching, constipation (Last bowel movement is Saturday night), nausea and vomiting (x 3 Saturday night). Pertinent negatives include no diarrhea, dysuria, flatus, frequency, headaches or hematuria. Nothing aggravates the pain. The pain is relieved by nothing. Treatments tried: Gas-x, Tums and Ginger Ale. The treatment provided no relief.   Last night ate some bread, cheese and pastrami and kept it down.  He states that he has been drinking water and a little bit of Gatorade.  He states that he does not drink any alcohol currently.  He states that his colonoscopy was "perfect."  He has never had any abdominal surgeries in the past.  Past Medical History:  Diagnosis Date  . Allergic reaction to insect sting   . Allergy   . Angio-edema   . Anxiety   . History of chicken pox   . Urticaria      Social History   Socioeconomic History  . Marital status: Married    Spouse name: Not on file  . Number of children: Not on file  . Years of education: Not on file  . Highest education level: Not on file  Occupational History  . Not on  file  Social Needs  . Financial resource strain: Not on file  . Food insecurity:    Worry: Not on file    Inability: Not on file  . Transportation needs:    Medical: Not on file    Non-medical: Not on file  Tobacco Use  . Smoking status: Never Smoker  . Smokeless tobacco: Never Used  Substance and Sexual Activity  . Alcohol use: No  . Drug use: No  . Sexual activity: Yes  Lifestyle  . Physical activity:    Days per week: Not on file    Minutes per session: Not on file  . Stress: Not on file  Relationships  . Social connections:    Talks on phone: Not on file    Gets together: Not on file    Attends religious service: Not on file    Active member of club or organization: Not on file    Attends meetings of clubs or organizations: Not on file    Relationship status: Not on file  . Intimate partner violence:    Fear of current or ex partner: Not on file    Emotionally abused: Not on file    Physically abused: Not on file    Forced sexual activity: Not on file  Other Topics Concern  . Not on file  Social History Narrative  . Not on file    Past Surgical  History:  Procedure Laterality Date  . EYE SURGERY Left 1972   detached retina  . VASECTOMY  2010    Family History  Problem Relation Age of Onset  . Diabetes Mother   . Prostate cancer Father     Allergies  Allergen Reactions  . Yellow Jacket Venom [Bee Venom] Hives and Shortness Of Breath    Current Medications:   Current Outpatient Medications:  .  EPINEPHrine 0.3 mg/0.3 mL IJ SOAJ injection, PLEASE SEE ATTACHED FOR DETAILED DIRECTIONS, Disp: , Rfl: 1 .  FLUoxetine (PROZAC) 20 MG capsule, Take 2 capsules (40 mg total) by mouth daily., Disp: 60 capsule, Rfl: 6 .  ibuprofen (ADVIL,MOTRIN) 200 MG tablet, Take 400 mg by mouth every 6 (six) hours as needed for headache (pain)., Disp: , Rfl:  .  lamoTRIgine (LAMICTAL) 100 MG tablet, Take 1 tablet (100 mg total) by mouth daily., Disp: 90 tablet, Rfl: 3 .   phentermine (ADIPEX-P) 37.5 MG tablet, TAKE 1 TABLET BY MOUTH EVERY DAY BEFORE BREAKFAST, Disp: , Rfl: 2   Review of Systems:   Review of Systems  Gastrointestinal: Positive for abdominal pain, constipation (Last bowel movement is Saturday night), nausea and vomiting (x 3 Saturday night). Negative for diarrhea and flatus.  Genitourinary: Negative for dysuria, frequency and hematuria.  Neurological: Negative for headaches.    Vitals:   Vitals:   08/19/18 0858  BP: 130/70  Pulse: 63  Temp: 98.6 F (37 C)  TempSrc: Oral  SpO2: 97%  Weight: 227 lb 6.1 oz (103.1 kg)  Height: 5\' 8"  (1.727 m)     Body mass index is 34.57 kg/m.  Physical Exam:   Physical Exam  Constitutional: He appears well-developed. He is cooperative.  Non-toxic appearance. He does not have a sickly appearance. He does not appear ill. No distress.  Cardiovascular: Normal rate, regular rhythm, S1 normal, S2 normal, normal heart sounds and normal pulses.  No LE edema  Pulmonary/Chest: Effort normal and breath sounds normal.  Abdominal: Normal appearance. Bowel sounds are decreased. There is generalized tenderness. There is no rigidity, no rebound, no guarding, no CVA tenderness, no tenderness at McBurney's point and negative Murphy's sign.  Tenderness mostly localized to the upper half of his abdomen  Neurological: He is alert. GCS eye subscore is 4. GCS verbal subscore is 5. GCS motor subscore is 6.  Skin: Skin is warm, dry and intact.  Psychiatric: He has a normal mood and affect. His speech is normal and behavior is normal.  Nursing note and vitals reviewed.    Assessment and Plan:   Kyle Haynes was seen today for abdominal pain.  Diagnoses and all orders for this visit:  Generalized abdominal pain Patient was also briefly examined by Dr. Helane RimaErica Wallace.  Unclear diagnosis at this time will order stat CT scan for further work-up.  Patient is agreeable to plan.  I also provided him with ER precautions should  his symptoms worsen.  We will treat based on findings.  We will also check a CBC and CMP today. -     CBC with Differential/Platelet -     Comprehensive metabolic panel -     CT ABDOMEN PELVIS W CONTRAST; Future  . Reviewed expectations re: course of current medical issues. . Discussed self-management of symptoms. . Outlined signs and symptoms indicating need for more acute intervention. . Patient verbalized understanding and all questions were answered. . See orders for this visit as documented in the electronic medical record. . Patient received an After-Visit  Summary.  CMA or LPN served as scribe during this visit. History, Physical, and Plan performed by medical provider. The above documentation has been reviewed and is accurate and complete.   Inda Coke, PA-C

## 2018-08-19 NOTE — Patient Instructions (Signed)
It was great to see you!  We will be in touch with your lab results.  Obtain the CT scan as ordered.  Take care,  Jarold MottoSamantha Arul Farabee PA-C  Get help right away if:  Your pain does not go away as soon as your doctor says it should.  You cannot stop throwing up.  Your pain is only in areas of your belly, such as the right side or the left lower part of the belly.  You have bloody or black poop, or poop that looks like tar.  You have very bad pain, cramping, or bloating in your belly.  You have signs of not having enough fluid or water in your body (dehydration), such as: ? Dark pee, very little pee, or no pee. ? Cracked lips. ? Dry mouth. ? Sunken eyes. ? Sleepiness. ? Weakness.

## 2018-08-20 ENCOUNTER — Telehealth: Payer: Self-pay | Admitting: Family Medicine

## 2018-08-20 NOTE — Telephone Encounter (Signed)
Ok to give note 

## 2018-08-20 NOTE — Telephone Encounter (Signed)
See note  Copied from CRM [id number]. Topic: General - Other >> Aug 20, 2018  3:22 PM Gerrianne ScalePayne, Angela L wrote: Reason for CRM: pt calling to get a work note for his job his stomach is still tender  out of work from Monday return on Thursday pt will  come by the office to pick it up on Thursday

## 2018-08-20 NOTE — Telephone Encounter (Signed)
Yes, ok to give note. 

## 2018-08-21 NOTE — Telephone Encounter (Signed)
Note given to patient today 

## 2018-08-28 ENCOUNTER — Other Ambulatory Visit: Payer: Self-pay | Admitting: Physician Assistant

## 2018-08-31 ENCOUNTER — Other Ambulatory Visit: Payer: Self-pay | Admitting: Family Medicine

## 2018-10-08 ENCOUNTER — Ambulatory Visit: Payer: Self-pay | Admitting: Family Medicine

## 2018-10-08 ENCOUNTER — Ambulatory Visit: Payer: Self-pay | Admitting: Physician Assistant

## 2018-11-25 DIAGNOSIS — H43813 Vitreous degeneration, bilateral: Secondary | ICD-10-CM | POA: Diagnosis not present

## 2018-11-25 DIAGNOSIS — H35411 Lattice degeneration of retina, right eye: Secondary | ICD-10-CM | POA: Diagnosis not present

## 2018-11-25 DIAGNOSIS — H33321 Round hole, right eye: Secondary | ICD-10-CM | POA: Diagnosis not present

## 2018-11-25 DIAGNOSIS — H31092 Other chorioretinal scars, left eye: Secondary | ICD-10-CM | POA: Diagnosis not present

## 2018-12-30 DIAGNOSIS — H35411 Lattice degeneration of retina, right eye: Secondary | ICD-10-CM | POA: Diagnosis not present

## 2018-12-30 DIAGNOSIS — H33321 Round hole, right eye: Secondary | ICD-10-CM | POA: Diagnosis not present

## 2019-03-05 ENCOUNTER — Other Ambulatory Visit: Payer: Self-pay | Admitting: Family Medicine

## 2019-03-05 DIAGNOSIS — F3181 Bipolar II disorder: Secondary | ICD-10-CM

## 2019-05-04 ENCOUNTER — Other Ambulatory Visit: Payer: Self-pay | Admitting: Family Medicine

## 2019-05-04 DIAGNOSIS — F3181 Bipolar II disorder: Secondary | ICD-10-CM

## 2019-05-05 NOTE — Telephone Encounter (Signed)
Last OV 05/08/2018  Please contact pt to schedule follow-up visit.

## 2019-05-06 NOTE — Telephone Encounter (Signed)
Patient is scheduled for an appointment for tomorrow. No need to route note.

## 2019-05-07 ENCOUNTER — Ambulatory Visit: Payer: 59 | Admitting: Family Medicine

## 2019-05-12 ENCOUNTER — Encounter: Payer: Self-pay | Admitting: Family Medicine

## 2019-05-12 ENCOUNTER — Ambulatory Visit (INDEPENDENT_AMBULATORY_CARE_PROVIDER_SITE_OTHER): Payer: 59 | Admitting: Family Medicine

## 2019-05-12 ENCOUNTER — Telehealth: Payer: Self-pay

## 2019-05-12 ENCOUNTER — Other Ambulatory Visit: Payer: Self-pay

## 2019-05-12 VITALS — Ht 68.0 in | Wt 227.0 lb

## 2019-05-12 DIAGNOSIS — M79642 Pain in left hand: Secondary | ICD-10-CM

## 2019-05-12 DIAGNOSIS — M79641 Pain in right hand: Secondary | ICD-10-CM

## 2019-05-12 DIAGNOSIS — F3181 Bipolar II disorder: Secondary | ICD-10-CM

## 2019-05-12 DIAGNOSIS — R1030 Lower abdominal pain, unspecified: Secondary | ICD-10-CM | POA: Diagnosis not present

## 2019-05-12 DIAGNOSIS — Z8042 Family history of malignant neoplasm of prostate: Secondary | ICD-10-CM

## 2019-05-12 DIAGNOSIS — E782 Mixed hyperlipidemia: Secondary | ICD-10-CM

## 2019-05-12 DIAGNOSIS — R635 Abnormal weight gain: Secondary | ICD-10-CM

## 2019-05-12 DIAGNOSIS — R7301 Impaired fasting glucose: Secondary | ICD-10-CM

## 2019-05-12 MED ORDER — LAMOTRIGINE 100 MG PO TABS: 100.0000 mg | ORAL_TABLET | Freq: Every day | ORAL | 0 refills | Status: DC

## 2019-05-12 MED ORDER — ESCITALOPRAM OXALATE 20 MG PO TABS: 20.0000 mg | ORAL_TABLET | Freq: Every day | ORAL | 3 refills | Status: DC

## 2019-05-12 NOTE — Telephone Encounter (Signed)
Patient states he received call from billing regarding an appointment he did not have with Lattie Haw. Let him know that I would send message to you for follow up.

## 2019-05-12 NOTE — Telephone Encounter (Signed)
Anderson Malta, can you look into this?

## 2019-05-12 NOTE — Progress Notes (Signed)
Virtual Visit via Video   Due to the COVID-19 pandemic, this visit was completed with telemedicine (audio/video) technology to reduce patient and provider exposure as well as to preserve personal protective equipment.   I connected with Kyle Haynes by a video enabled telemedicine application and verified that I am speaking with the correct person using two identifiers. Location patient: Home Location provider: [facility], Office Persons participating in the virtual visit: Kyle Haynes, [place], DO   I discussed the limitations of evaluation and management by telemedicine and the availability of in person appointments. The patient expressed understanding and agreed to proceed.  Care Team   Patient Care Team: Briscoe Deutscher, DO as PCP - General (Family Medicine)  Subjective:   HPI: Patient in for refill on Lamictal 100mg . Patient feels like Lamictal with Prozac it helping He feels "normal" on both medications. He has requested referral to GI for evaluation of past diverticulosis. He also would like to have PSA checked. He has family history of prostate cancer of his father with diagnosis in his 8's. He has not had any urinary symptoms as far as stream he has been waking up several times a night to go to bath room with urgency.    Feels to flat emotionally.  Is interested in changing the Prozac to some other medication to see if that is more helpful.  Wants to continue on the Lamictal to keep him from having any mania.  His wife is noticed his mood.  He is having marital conflict.  It is his fourth marriage.  Patient planes of bilateral hand pain and numbness.  It can wake him from sleep at night.  Stretching helps.  Interested in intervention.  Review of Systems  Constitutional: Negative for chills and fever.  HENT: Negative for hearing loss and tinnitus.   Eyes: Negative for blurred vision and double vision.  Respiratory: Negative for cough and hemoptysis.     Cardiovascular: Negative for chest pain and palpitations.  Gastrointestinal: Negative for heartburn and nausea.  Genitourinary: Negative for dysuria and urgency.  Musculoskeletal: Negative for myalgias and neck pain.  Neurological: Negative for dizziness and headaches.  Psychiatric/Behavioral: Negative for depression and suicidal ideas.    Patient Active Problem List   Diagnosis Date Noted  . 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 Pain in joint involving ankle and foot 04/14/2018  . Osteoarthritis of left hip 03/13/2018  . Mixed hyperlipidemia 07/22/2017  . Bipolar 2 disorder ([place]) 03/09/2017  . AC separation, left, initial encounter 01/23/2017  . Bilateral carpal tunnel syndrome 01/23/2017  . Chronic midline low back pain without sciatica 01/18/2017  . Trigger middle finger 01/18/2017  . Pain in both hands 01/18/2017  . Obesity (BMI 30.0-34.9) 01/18/2017  . Family history of diabetes mellitus type II 03/09/2016  . Family history of prostate cancer 03/09/2016  . GAD (generalized anxiety disorder) 03/09/2016    Social History   Tobacco Use  . Smoking status: Never Smoker  . Smokeless tobacco: Never Used  Substance Use Topics  . Alcohol use: No   Current Outpatient Medications:  .  EPINEPHrine 0.3 mg/0.3 mL IJ SOAJ injection, PLEASE SEE ATTACHED FOR DETAILED DIRECTIONS, Disp: , Rfl: 1 .  FLUoxetine (PROZAC) 20 MG capsule, Take 2 capsules (40 mg total) by mouth daily., Disp: 60 capsule, Rfl: 6 .  ibuprofen (ADVIL,MOTRIN) 200 MG tablet, Take 400 mg by mouth every 6 (six) hours as needed for headache (pain)., Disp: , Rfl:  .  lamoTRIgine (LAMICTAL) 100  MG tablet, TAKE 1 TABLET BY MOUTH EVERY DAY, Disp: 30 tablet, Rfl: 0  Allergies  Allergen Reactions  . Yellow Jacket Venom [Bee Venom] Hives and Shortness Of Breath    Objective:   VITALS: Per patient if applicable, see vitals. GENERAL: Alert, appears well and in no acute distress. HEENT: Atraumatic, conjunctiva clear, no obvious  abnormalities on inspection of external nose and ears. NECK: Normal movements of the head and neck. CARDIOPULMONARY: No increased WOB. Speaking in clear sentences. I:E ratio WNL.  MS: Moves all visible extremities without noticeable abnormality. PSYCH: Pleasant and cooperative, well-groomed. Speech normal rate and rhythm. Affect is appropriate. Insight and judgement are appropriate. Attention is focused, linear, and appropriate.  NEURO: CN grossly intact. Oriented as arrived to appointment on time with no prompting. Moves both UE equally.  SKIN: No obvious lesions, wounds, erythema, or cyanosis noted on face or hands.  Dep[facility] screen [facility] 2[facility] 05/12/2019 02/12/2018 01/18/2017  Decreased Interest 0 0 0  Down, Depressed, Hopeless 0 0 0  PHQ - 2 Score 0 0 0  Altered sleeping 1 - -  Tired, decreased energy 0 - -  Change in appetite 0 - -  Feeling bad or failure about yourself  0 - -  Trouble concentrating 0 - -  Moving slowly or fidgety/restless 0 - -  Suicidal thoughts 0 - -  PHQ-9 Score 1 - -  Difficult doing work/chores Not difficult at all - -    Assessment and Plan:   Kyle PaliSebastian was seen today for follow-up.  Diagnoses and all orders for this visit:  Bipolar 2 disorder (HCC) Comments: Will transition from Prozac to Lexapro.  Expectations and precautions reviewed. Orders: -     lamoTRIgine (LAMICTAL) 100 MG tablet; Take 1 tablet (100 mg total) by mouth daily. -     escitalopram (LEXAPRO) 20 MG tablet; Take 1 tablet (20 mg total) by mouth daily.  Morbid obesity (HCC) Comments: Weight gain during COVID.  Pain in both hands Comments: Likely carpal tunnel bilaterally.  Discussed stretches and ice.  Okay to sports medicine for follow-up. Orders: -     Ambulatory referral to Sports Medicine  Lower abdominal pain -     Ambulatory referral to Gastroenterology  Family history of prostate cancer Comments: Patient wants PSA drawn today. Orders: -     PSA; Future  Weight  gain -     CBC with Differential/Platelet; Future -     Comprehensive metabolic panel; Future -     TSH; Future  Mixed hyperlipidemia Comments: Due for labs. Orders: -     Comprehensive metabolic panel; Future -     Lipid panel; Future  Fasting hyperglycemia -     Hemoglobin A1c; Future   . COVID-19 Education: The signs and symptoms of COVID-19 were discussed with the patient and how to seek care for testing if needed. The importance of social distancing was discussed today. . Reviewed expectations re: course of current medical issues. . Discussed self-management of symptoms. . Outlined signs and symptoms indicating need for more acute intervention. . Patient verbalized understanding and all questions were answered. Marland Kitchen. Health Maintenance issues including appropriate healthy diet, exercise, and smoking avoidance were discussed with patient. . See orders for this visit as documented in the electronic medical record.  Kyle RimaErica Kenston Longton, DO  Records requested if needed. Time spent: 25 minutes, of which >50% was spent in obtaining information about his symptoms, reviewing his previous labs, evaluations, and treatments, counseling him about his condition (please  see the discussed topics above), and developing a plan to further investigate it; he had a number of questions which I addressed.

## 2019-05-14 ENCOUNTER — Other Ambulatory Visit (INDEPENDENT_AMBULATORY_CARE_PROVIDER_SITE_OTHER): Payer: 59

## 2019-05-14 ENCOUNTER — Other Ambulatory Visit: Payer: Self-pay

## 2019-05-14 DIAGNOSIS — R7301 Impaired fasting glucose: Secondary | ICD-10-CM

## 2019-05-14 DIAGNOSIS — Z8042 Family history of malignant neoplasm of prostate: Secondary | ICD-10-CM | POA: Diagnosis not present

## 2019-05-14 DIAGNOSIS — R635 Abnormal weight gain: Secondary | ICD-10-CM

## 2019-05-14 DIAGNOSIS — E782 Mixed hyperlipidemia: Secondary | ICD-10-CM

## 2019-05-14 LAB — CBC WITH DIFFERENTIAL/PLATELET
Basophils Absolute: 0.1 10*3/uL (ref 0.0–0.1)
Basophils Relative: 0.8 % (ref 0.0–3.0)
Eosinophils Absolute: 0.2 10*3/uL (ref 0.0–0.7)
Eosinophils Relative: 2.3 % (ref 0.0–5.0)
HCT: 41.6 % (ref 39.0–52.0)
Hemoglobin: 14 g/dL (ref 13.0–17.0)
Lymphocytes Relative: 29.6 % (ref 12.0–46.0)
Lymphs Abs: 2.1 10*3/uL (ref 0.7–4.0)
MCHC: 33.7 g/dL (ref 30.0–36.0)
MCV: 87.4 fl (ref 78.0–100.0)
Monocytes Absolute: 0.4 10*3/uL (ref 0.1–1.0)
Monocytes Relative: 5.3 % (ref 3.0–12.0)
Neutro Abs: 4.3 10*3/uL (ref 1.4–7.7)
Neutrophils Relative %: 62 % (ref 43.0–77.0)
Platelets: 287 10*3/uL (ref 150.0–400.0)
RBC: 4.75 Mil/uL (ref 4.22–5.81)
RDW: 13.8 % (ref 11.5–15.5)
WBC: 7 10*3/uL (ref 4.0–10.5)

## 2019-05-14 LAB — COMPREHENSIVE METABOLIC PANEL
ALT: 32 U/L (ref 0–53)
AST: 21 U/L (ref 0–37)
Albumin: 4.2 g/dL (ref 3.5–5.2)
Alkaline Phosphatase: 77 U/L (ref 39–117)
BUN: 18 mg/dL (ref 6–23)
CO2: 21 mEq/L (ref 19–32)
Calcium: 9.1 mg/dL (ref 8.4–10.5)
Chloride: 107 mEq/L (ref 96–112)
Creatinine, Ser: 0.96 mg/dL (ref 0.40–1.50)
GFR: 80.35 mL/min (ref >60.00)
Glucose, Bld: 132 mg/dL — ABNORMAL HIGH (ref 70–99)
Potassium: 4.2 mEq/L (ref 3.5–5.1)
Sodium: 139 mEq/L (ref 135–145)
Total Bilirubin: 0.3 mg/dL (ref 0.2–1.2)
Total Protein: 6.9 g/dL (ref 6.0–8.3)

## 2019-05-14 LAB — LIPID PANEL
Cholesterol: 217 mg/dL — ABNORMAL HIGH (ref 0–200)
HDL: 23.4 mg/dL — ABNORMAL LOW (ref >39.00)
Total CHOL/HDL Ratio: 9
Triglycerides: 738 mg/dL — ABNORMAL HIGH (ref 0.0–149.0)

## 2019-05-14 LAB — LDL CHOLESTEROL, DIRECT: Direct LDL: 101 mg/dL

## 2019-05-14 LAB — TSH: TSH: 1.78 u[IU]/mL (ref 0.35–4.50)

## 2019-05-14 LAB — HEMOGLOBIN A1C: Hgb A1c MFr Bld: 6.1 % (ref 4.6–6.5)

## 2019-05-14 LAB — PSA: PSA: 2.65 ng/mL (ref 0.10–4.00)

## 2019-05-19 ENCOUNTER — Other Ambulatory Visit: Payer: Self-pay

## 2019-05-19 MED ORDER — EZETIMIBE 10 MG PO TABS: 10.0000 mg | ORAL_TABLET | Freq: Every day | ORAL | 3 refills | Status: DC

## 2019-05-19 NOTE — Progress Notes (Signed)
ze

## 2019-05-29 ENCOUNTER — Encounter: Payer: Self-pay | Admitting: Gastroenterology

## 2019-06-03 ENCOUNTER — Other Ambulatory Visit: Payer: Self-pay | Admitting: Family Medicine

## 2019-06-03 DIAGNOSIS — F3181 Bipolar II disorder: Secondary | ICD-10-CM

## 2019-06-09 ENCOUNTER — Ambulatory Visit: Payer: 59 | Admitting: Gastroenterology

## 2019-06-10 ENCOUNTER — Ambulatory Visit: Payer: 59 | Admitting: Family Medicine

## 2019-06-10 DIAGNOSIS — Z0289 Encounter for other administrative examinations: Secondary | ICD-10-CM

## 2019-06-10 NOTE — Progress Notes (Deleted)
[facility] [place], [place] [address] Phone: [phone] Subjective:    I'm seeing this patient by the request  of:    CC: hand pain   ZRA:[id number]  Kyle Haynes is a 58 y.o. male coming in with complaint of ***  Onset-  Location Duration-  Character- Aggravating factors- Reliving factors-  Therapies tried-  Severity-     Past Medical History:  Diagnosis Date  . Allergic reaction to insect sting   . Allergy   . Angio-edema   . Anxiety   . History of chicken pox   . Urticaria    Past Surgical History:  Procedure Laterality Date  . EYE SURGERY Left 1972   detached retina  . VASECTOMY  2010   Social History   Socioeconomic History  . Marital status: Married    Spouse name: Not on file  . Number of children: Not on file  . Years of education: Not on file  . Highest education level: Not on file  Occupational History  . Not on file  Social Needs  . Financial resource strain: Not on file  . Food insecurity    Worry: Not on file    Inability: Not on file  . Transportation needs    Medical: Not on file    Non-medical: Not on file  Tobacco Use  . Smoking status: Never Smoker  . Smokeless tobacco: Never Used  Substance and Sexual Activity  . Alcohol use: No  . Drug use: No  . Sexual activity: Yes  Lifestyle  . Physical activity    Days per week: Not on file    Minutes per session: Not on file  . Stress: Not on file  Relationships  . Social Herbalist on phone: Not on file    Gets together: Not on file    Attends religious service: Not on file    Active member of club or organization: Not on file    Attends meetings of clubs or organizations: Not on file    Relationship status: Not on file  Other Topics Concern  . Not on file  Social History Narrative  . Not on file   Allergies  Allergen Reactions  . Yellow Jacket Venom [Bee Venom] Hives and Shortness Of Breath   Family History   Problem Relation Age of Onset  . Diabetes Mother   . Prostate cancer Father        late 2's     Current Outpatient Medications (Cardiovascular):  Marland Kitchen  EPINEPHrine 0.3 mg/0.3 mL IJ SOAJ injection, PLEASE SEE ATTACHED FOR DETAILED DIRECTIONS .  ezetimibe (ZETIA) 10 MG tablet, Take 1 tablet (10 mg total) by mouth daily.     Current Outpatient Medications (Other):  .  escitalopram (LEXAPRO) 20 MG tablet, TAKE 1 TABLET BY MOUTH EVERY DAY .  lamoTRIgine (LAMICTAL) 100 MG tablet, Take 1 tablet (100 mg total) by mouth daily.    Past medical history, social, surgical and family history all reviewed in electronic medical record.  No pertanent information unless stated regarding to the chief complaint.   Review of Systems:  No headache, visual changes, nausea, vomiting, diarrhea, constipation, dizziness, abdominal pain, skin rash, fevers, chills, night sweats, weight loss, swollen lymph nodes, body aches, joint swelling, muscle aches, chest pain, shortness of breath, mood changes.   Objective  There were no vitals taken for this visit. Systems examined below as of    General: No apparent  distress alert and oriented x3 mood and affect normal, dressed appropriately.  HEENT: Pupils equal, extraocular movements intact  Respiratory: Patient's speak in full sentences and does not appear short of breath  Cardiovascular: No lower extremity edema, non tender, no erythema  Skin: Warm dry intact with no signs of infection or rash on extremities or on axial skeleton.  Abdomen: Soft nontender  Neuro: Cranial nerves II through XII are intact, neurovascularly intact in all extremities with 2+ DTRs and 2+ pulses.  Lymph: No lymphadenopathy of posterior or anterior cervical chain or axillae bilaterally.  Gait normal with good balance and coordination.  MSK:  Non tender with full range of motion and good stability and symmetric strength and tone of shoulders, elbows, wrist, hip, knee and ankles bilaterally.      Impression and Recommendations:     This case required medical decision making of moderate complexity. The above documentation has been reviewed and is accurate and complete Judi Saa, DO       Note: This dictation was prepared with Dragon dictation along with smaller phrase technology. Any transcriptional errors that result from this process are unintentional.

## 2019-06-18 ENCOUNTER — Other Ambulatory Visit: Payer: Self-pay | Admitting: Family Medicine

## 2019-06-18 DIAGNOSIS — F3181 Bipolar II disorder: Secondary | ICD-10-CM

## 2019-06-20 NOTE — Telephone Encounter (Signed)
Last fill 05/12/19 #30/0 Last OV 05/12/19

## 2019-06-27 ENCOUNTER — Ambulatory Visit: Payer: 59 | Admitting: Gastroenterology

## 2019-07-07 ENCOUNTER — Other Ambulatory Visit: Payer: Self-pay

## 2019-07-07 ENCOUNTER — Encounter: Payer: Self-pay | Admitting: Gastroenterology

## 2019-07-07 ENCOUNTER — Ambulatory Visit (INDEPENDENT_AMBULATORY_CARE_PROVIDER_SITE_OTHER): Payer: 59 | Admitting: Gastroenterology

## 2019-07-07 VITALS — BP 122/82 | HR 77 | Temp 98.4°F | Ht 68.0 in | Wt 256.4 lb

## 2019-07-07 DIAGNOSIS — R1013 Epigastric pain: Secondary | ICD-10-CM | POA: Diagnosis not present

## 2019-07-07 NOTE — Patient Instructions (Signed)
If you are age 58 or older, your body mass index should be between 23-30. Your Body mass index is 38.98 kg/m. If this is out of the aforementioned range listed, please consider follow up with your Primary Care Provider.  If you are age 69 or younger, your body mass index should be between 19-25. Your Body mass index is 38.98 kg/m. If this is out of the aformentioned range listed, please consider follow up with your Primary Care Provider.   Please go to the lab at [facility] ([place] You will need to go to level "B", you do not need an appointment for this. Hours available are 7:30 am - 4:30 pm.   Follow up as needed.   It was a pleasure to see you today!  Vito Cirigliano, D.O.

## 2019-07-07 NOTE — Progress Notes (Signed)
Chief Complaint: MEG pain  Referring Provider:     Briscoe Deutscher, DO   HPI:    Kyle Haynes is a 58 y.o. male with history of hypertriglyceridemia referred to the Gastroenterology Clinic for evaluation of episodic MEG pain x3 years. Sxs started while visiting [place]. Went to the ER, treated w/ GI cocktail, Gas-X, and pain meds with improvement. 3 months later with recurrence. Recurrence in 08/2018 and went to ER. WBC 12.8, o/w normal CBC and CMP. CT largely unrevealing as below, but was diagnosed with presumed diverticulitis (despite CT) and treated with Abx. Has sine had 2 more episodes, but less intense. Last episode was 6+ months ago. No associated n/v/d/c/f/c, hematochezia, melena. Trialed Gas-X and Tums on own, with some improvement.  No hx of reflux sxs, dysphagia, odynohagia. No weight loss.  He is otherwise been in his normal state of health, and essentially has no complaints today.  -Normal CBC and CMP in 04/2019. -CT (08/2018): Normal liver, pancreas.  Diverticulosis without diverticulitis.  Otherwise normal GI.  Endoscopic history: -Colonoscopy (03/2016, [facility]): Normal.  Repeat 10 years.  No previous EGD.   No known family history of CRC, GI malignancy, liver disease, pancreatic disease, or IBD.    Past Medical History:  Diagnosis Date  . Allergic reaction to insect sting   . Allergy   . Angio-edema   . Anxiety   . History of chicken pox   . Urticaria      Past Surgical History:  Procedure Laterality Date  . COLONOSCOPY  03/31/2016   [facility]   . EYE SURGERY Left 1972   detached retina  . VASECTOMY  2010   Family History  Problem Relation Age of Onset  . Diabetes Mother   . Prostate cancer Father        late 63's  . Colon cancer Neg Hx   . Esophageal cancer Neg Hx    Social History   Tobacco Use  . Smoking status: Never Smoker  . Smokeless tobacco: Never Used  Substance Use Topics  . Alcohol use: No  . Drug use: Not Currently   Types: Marijuana    Comment: haven't in 8 years    Current Outpatient Medications  Medication Sig Dispense Refill  . EPINEPHrine 0.3 mg/0.3 mL IJ SOAJ injection PLEASE SEE ATTACHED FOR DETAILED DIRECTIONS  1  . escitalopram (LEXAPRO) 20 MG tablet TAKE 1 TABLET BY MOUTH EVERY DAY 90 tablet 0  . ezetimibe (ZETIA) 10 MG tablet Take 1 tablet (10 mg total) by mouth daily. 90 tablet 3  . gabapentin (NEURONTIN) 300 MG capsule Take 300 mg by mouth at bedtime.    . lamoTRIgine (LAMICTAL) 100 MG tablet TAKE 1 TABLET BY MOUTH EVERY DAY 30 tablet 0   No current facility-administered medications for this visit.    Allergies  Allergen Reactions  . Yellow Jacket Venom [Bee Venom] Hives and Shortness Of Breath     Review of Systems: All systems reviewed and negative except where noted in HPI.     Physical Exam:    Wt Readings from Last 3 Encounters:  07/07/19 256 lb 6 oz (116.3 kg)  05/12/19 227 lb (103 kg)  08/19/18 227 lb 6.1 oz (103.1 kg)    BP 122/82   Pulse 77   Temp 98.4 F (36.9 C)   Ht 5\' 8"  (1.727 m)   Wt 256 lb 6 oz (116.3 kg)   BMI  38.98 kg/m  Constitutional:  Pleasant, in no acute distress. Psychiatric: Normal mood and affect. Behavior is normal. EENT: Pupils normal.  Conjunctivae are normal. No scleral icterus. Neck supple. No cervical LAD. Cardiovascular: Normal rate, regular rhythm. No edema Pulmonary/chest: Effort normal and breath sounds normal. No wheezing, rales or rhonchi. Abdominal: Soft, nondistended, nontender. Bowel sounds active throughout. There are no masses palpable. No hepatomegaly. Neurological: Alert and oriented to person place and time. Skin: Skin is warm and dry. No rashes noted.   ASSESSMENT AND PLAN;   1) MEG pain Episodic epigastric pain over the last 3 years or so.  Aside from an isolated leukocytosis in 08/2018, unrevealing work-up.  Unclear etiology, and since symptoms have not occurred in more than 6 months, low diagnostic yield in  endoscopy at this time. -Check H. pylori serology for test and treat methods -Diary surrounding events should symptoms recur -To call me if symptoms recur, and can discuss utility of EGD at that time -Discussed all this in detail with the patient today, and he agrees with conservative approach as outlined  RTC prn   Shellia Cleverly, DO, FACG  07/07/2019, 4:13 PM   [name], DO

## 2019-10-07 ENCOUNTER — Other Ambulatory Visit: Payer: Self-pay

## 2019-10-07 ENCOUNTER — Telehealth: Payer: Self-pay | Admitting: Family Medicine

## 2019-10-07 ENCOUNTER — Telehealth: Payer: Self-pay | Admitting: Gastroenterology

## 2019-10-07 ENCOUNTER — Other Ambulatory Visit (INDEPENDENT_AMBULATORY_CARE_PROVIDER_SITE_OTHER): Payer: 59

## 2019-10-07 DIAGNOSIS — R1013 Epigastric pain: Secondary | ICD-10-CM | POA: Diagnosis not present

## 2019-10-07 DIAGNOSIS — F3181 Bipolar II disorder: Secondary | ICD-10-CM

## 2019-10-07 LAB — H. PYLORI ANTIBODY, IGG: H Pylori IgG: NEGATIVE

## 2019-10-07 MED ORDER — ESCITALOPRAM OXALATE 20 MG PO TABS: 20.0000 mg | ORAL_TABLET | Freq: Every day | ORAL | 0 refills | Status: DC

## 2019-10-07 MED ORDER — LAMOTRIGINE 100 MG PO TABS: 100.0000 mg | ORAL_TABLET | Freq: Every day | ORAL | 0 refills | Status: DC

## 2019-10-07 NOTE — Telephone Encounter (Signed)
Please check H pylori, ESR, CRP, and CBC now that he has acute sxs. If unrevealing and ongoing sxs, may plan for expedited EGD. Also, please ask about change in diet prior to sxs onset. Thanks.

## 2019-10-07 NOTE — Telephone Encounter (Signed)
MEDICATION:lamoTRIgine (LAMICTAL) 100 MG tablet escitalopram (LEXAPRO) 20 MG tablet  PHARMACY: [id number] [place], [place] - [address] Phone:  [phone]  Fax:  [phone]       Comments: Patient stated he only has 2 pills left. Patient is schedule for a TOC for 02/13/20.  **Let patient know to contact pharmacy at the end of the day to make sure medication is ready. **  ** Please notify patient to allow 48-72 hours to process**  **Encourage patient to contact the pharmacy for refills or they can request refills through [facility]**

## 2019-10-07 NOTE — Telephone Encounter (Signed)
Called and spoke with patient-patient reports he will complete the lab work today as MD has requested; patient reports he did not change his diet up or eat anything that would have "upset his system"; he does "not want to eat anything that would cause me to feel this bad";  Lab work to be reviewed once submitted

## 2019-10-07 NOTE — Telephone Encounter (Signed)
Patient is calling states he is now having symptoms that he was having prior to his appt back in Oct of 2020. States that Dr. Barron Alvine wanted him to get labs done only when he had these symptoms so the patient is seeking further info on where to go and the order.

## 2019-10-07 NOTE — Telephone Encounter (Signed)
[facility] [address] in [place], [place].

## 2019-10-07 NOTE — Telephone Encounter (Signed)
Please review previous message and advise-last seen in 06/2019

## 2019-10-08 IMAGING — CT CT ABD-PELV W/ CM
2 of 5 series · 16 of 46 positions shown, 18 images · IV contrast (ISOVUE 300)
Comparison: No priors.

CLINICAL DATA: 57-year-old male with history of severe upper
abdominal pain and right-sided abdominal pain [REDACTED] night
followed by emesis 3 times on [REDACTED]. Evaluate for appendicitis.

EXAM:
CT ABDOMEN AND PELVIS WITH CONTRAST
TECHNIQUE: Multidetector CT imaging of the abdomen and pelvis was performed
using the standard protocol following bolus administration of
intravenous contrast.
CONTRAST:  100mL 4H9KFG-N33 IOPAMIDOL (4H9KFG-N33) INJECTION 61%

[Series 2: abd/pel w · axial · 0.91mm/px · z∈[-523,-78]mm · 13 of 101 slices shown, 15 images]
[im 6/101  soft-tissue]
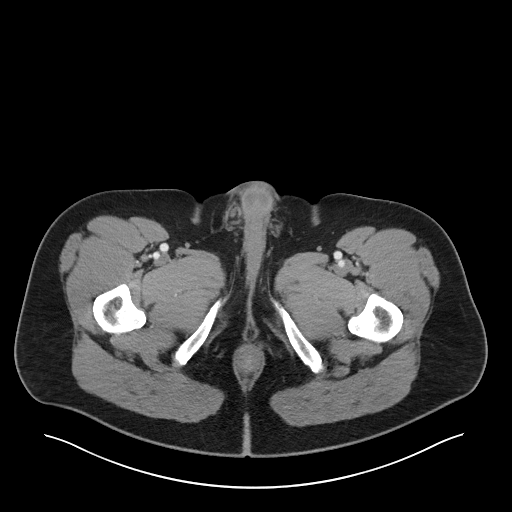
[im 6/101  bone]
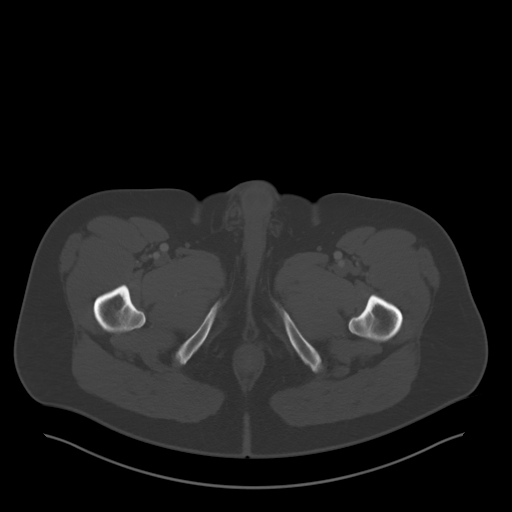
[im 16/101  soft-tissue]
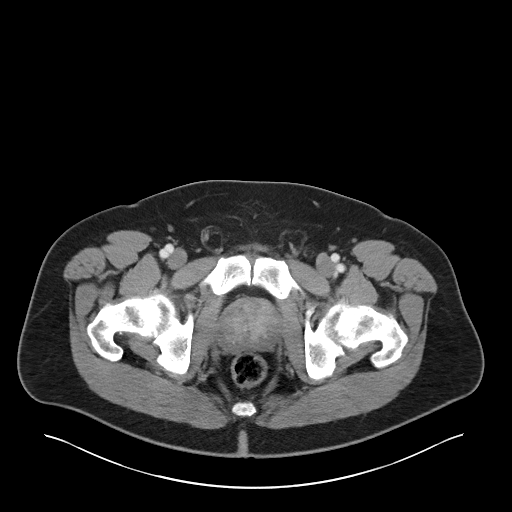
[im 22/101  soft-tissue]
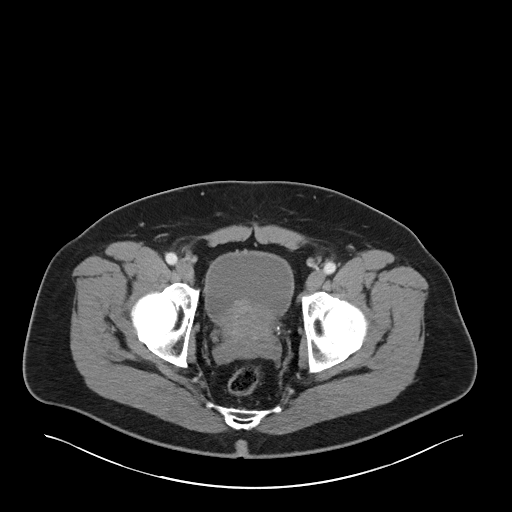
[im 27/101  soft-tissue]
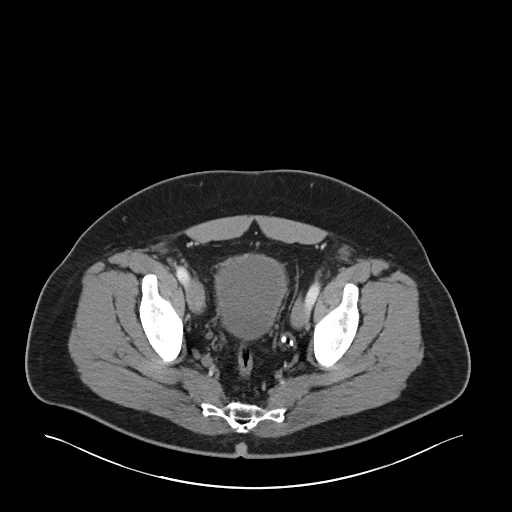
[im 37/101  soft-tissue]
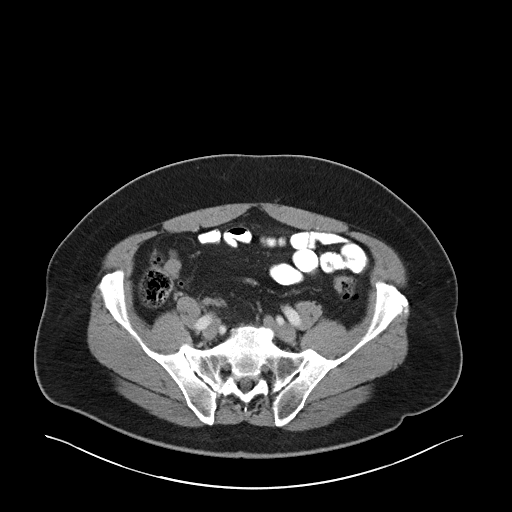
[im 43/101  soft-tissue]
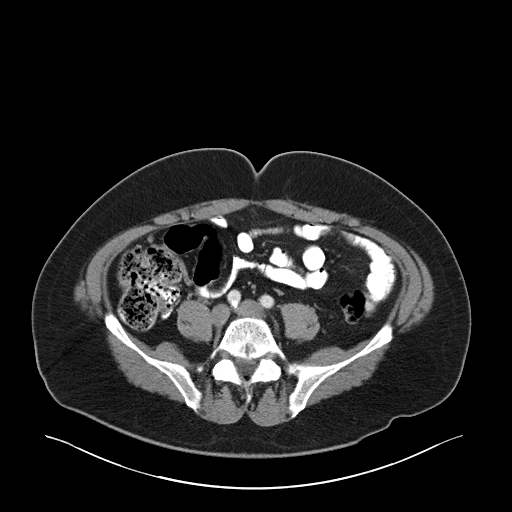
[im 53/101  soft-tissue]
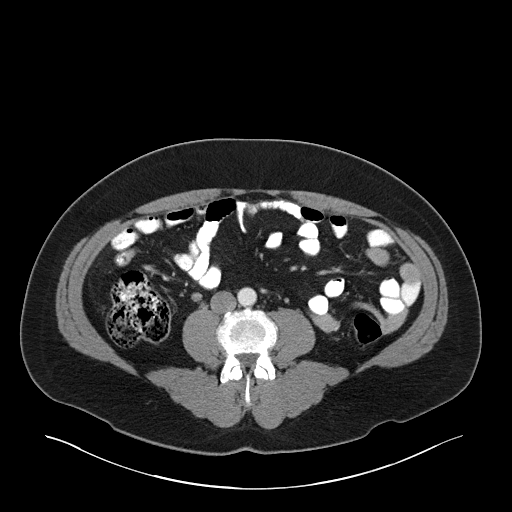
[im 58/101  soft-tissue]
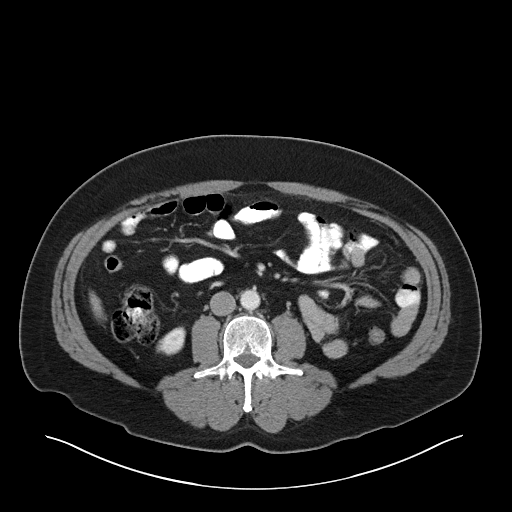
[im 64/101  soft-tissue]
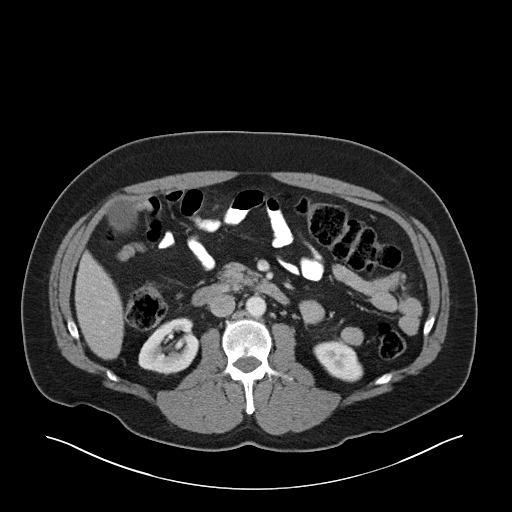
[im 64/101  bone]
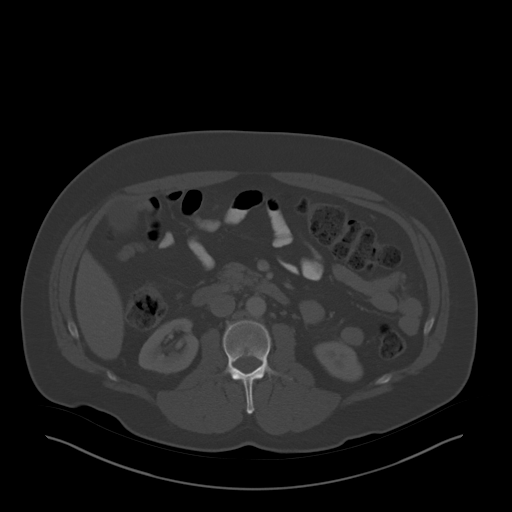
[im 74/101  soft-tissue]
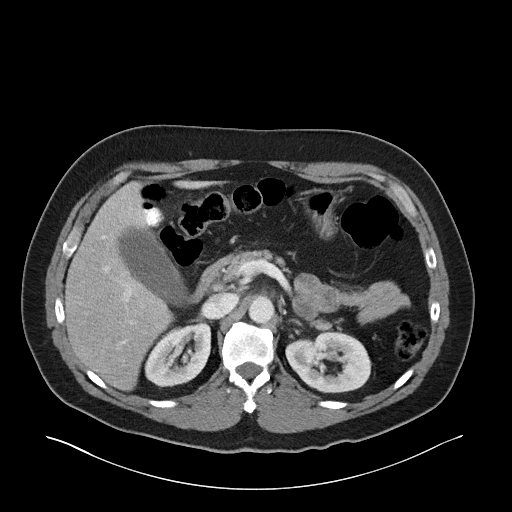
[im 79/101  soft-tissue]
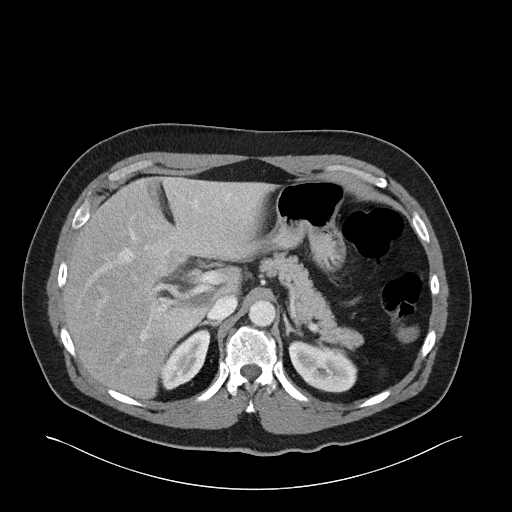
[im 85/101  soft-tissue]
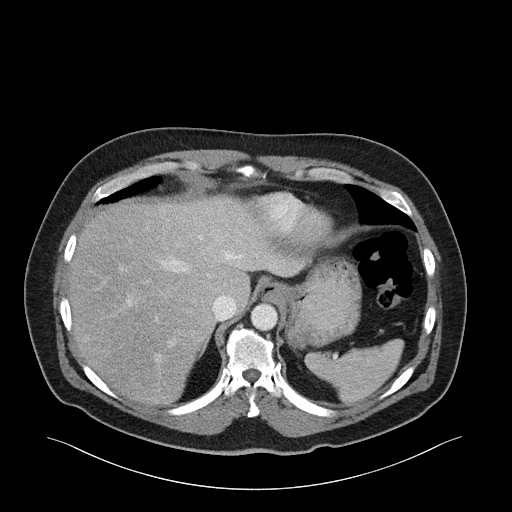
[im 95/101  soft-tissue]
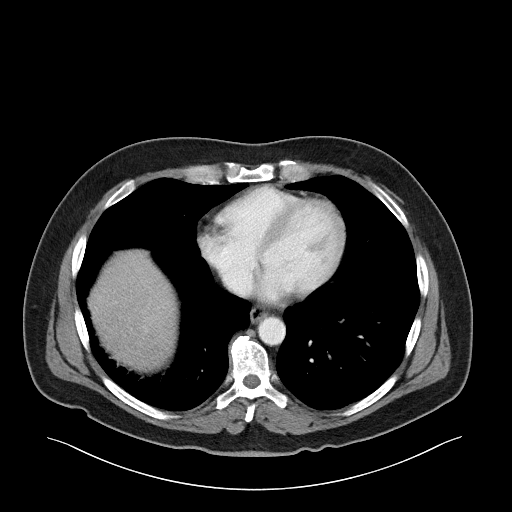

[Series 6: abd/pel w st · coronal · 0.80mm/px · 3 of 91 slices shown]
[im 31/91  soft-tissue]
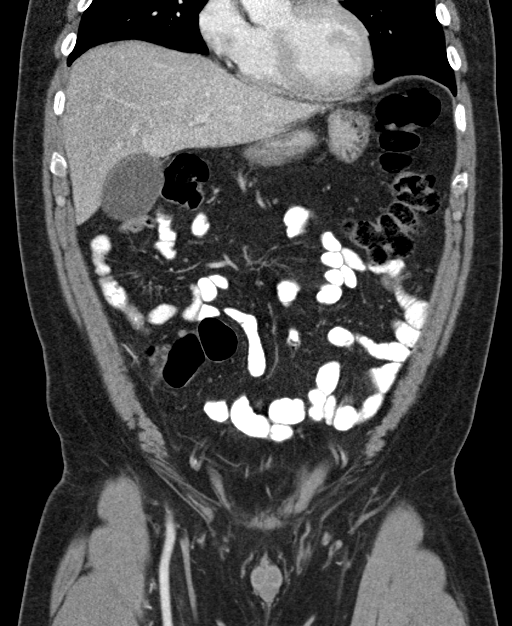
[im 41/91  soft-tissue]
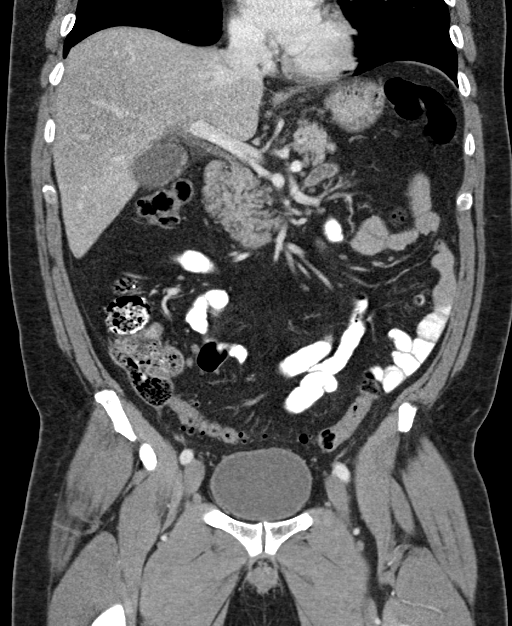
[im 51/91  soft-tissue]
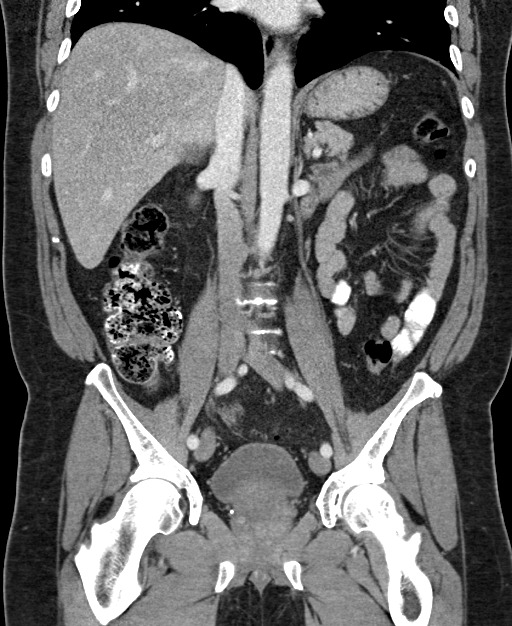

[16 of 46 positions shown; findings below may reference images not displayed]

FINDINGS: Lower chest: [DATE] x 2.7 cm low-attenuation lesion in the right
pericardiacophrenic angle, most likely to represent a small
pericardial cyst.

Hepatobiliary: No suspicious cystic or solid hepatic lesions. No
intra or extrahepatic biliary ductal dilatation. Gallbladder is
normal in appearance.

Pancreas: No pancreatic mass. No pancreatic ductal dilatation. No
pancreatic or peripancreatic fluid or inflammatory changes.

Spleen: Unremarkable.

Adrenals/Urinary Tract: 1.7 cm low-attenuation lesion in the
posterior aspect of the interpolar region of the right kidney,
compatible with a simple cyst. 2 mm nonobstructive calculus in the
interpolar collecting system of left kidney. No
hydroureteronephrosis. Urinary bladder is normal in appearance.
Bilateral adrenal glands are normal in appearance.

Stomach/Bowel: Normal appearance of the stomach. No pathologic
dilatation of small bowel or colon. Numerous colonic diverticulae
are noted, without surrounding inflammatory changes to suggest an
acute diverticulitis at this time. Normal appendix.

Vascular/Lymphatic: Aortic atherosclerosis. No lymphadenopathy noted
in the abdomen or pelvis.

Reproductive: Median lobe hypertrophy in the prostate gland. Seminal
vesicles are unremarkable in appearance.

Other: No significant volume of ascites.  No pneumoperitoneum.

Musculoskeletal: There are no aggressive appearing lytic or blastic
lesions noted in the visualized portions of the skeleton.
IMPRESSION: 1. No acute findings are noted in the abdomen to account for the
patient's symptoms. Specifically, both the gallbladder and appendix
are normal in appearance.
2. Colonic diverticulosis without evidence of acute diverticulitis
at this time.
3. Aortic atherosclerosis.
4. Additional incidental findings, as above.

## 2019-11-01 ENCOUNTER — Other Ambulatory Visit: Payer: Self-pay | Admitting: Family Medicine

## 2019-11-01 DIAGNOSIS — F3181 Bipolar II disorder: Secondary | ICD-10-CM

## 2020-01-06 ENCOUNTER — Other Ambulatory Visit: Payer: Self-pay | Admitting: Family Medicine

## 2020-01-06 DIAGNOSIS — F3181 Bipolar II disorder: Secondary | ICD-10-CM

## 2020-01-06 NOTE — Telephone Encounter (Signed)
Pt has TOC appt with me in May. Refilled meds. Needs to keep appt for further refills.

## 2020-02-13 ENCOUNTER — Encounter: Payer: 59 | Admitting: Family Medicine

## 2020-04-25 ENCOUNTER — Other Ambulatory Visit: Payer: Self-pay | Admitting: Family Medicine

## 2020-04-25 DIAGNOSIS — F3181 Bipolar II disorder: Secondary | ICD-10-CM

## 2020-06-04 ENCOUNTER — Ambulatory Visit: Payer: 59 | Admitting: Family Medicine

## 2020-06-04 ENCOUNTER — Encounter: Payer: Self-pay | Admitting: Family Medicine

## 2020-06-04 ENCOUNTER — Other Ambulatory Visit: Payer: Self-pay

## 2020-06-04 VITALS — BP 120/78 | HR 82 | Temp 98.6°F | Ht 68.0 in | Wt 258.6 lb

## 2020-06-04 DIAGNOSIS — E781 Pure hyperglyceridemia: Secondary | ICD-10-CM

## 2020-06-04 DIAGNOSIS — R7303 Prediabetes: Secondary | ICD-10-CM

## 2020-06-04 DIAGNOSIS — E669 Obesity, unspecified: Secondary | ICD-10-CM | POA: Diagnosis not present

## 2020-06-04 DIAGNOSIS — Z23 Encounter for immunization: Secondary | ICD-10-CM | POA: Diagnosis not present

## 2020-06-04 DIAGNOSIS — Z0000 Encounter for general adult medical examination without abnormal findings: Secondary | ICD-10-CM | POA: Diagnosis not present

## 2020-06-04 DIAGNOSIS — M6530 Trigger finger, unspecified finger: Secondary | ICD-10-CM

## 2020-06-04 DIAGNOSIS — F3181 Bipolar II disorder: Secondary | ICD-10-CM

## 2020-06-04 DIAGNOSIS — G5603 Carpal tunnel syndrome, bilateral upper limbs: Secondary | ICD-10-CM

## 2020-06-04 MED ORDER — PHENTERMINE HCL 37.5 MG PO TABS
37.5000 mg | ORAL_TABLET | Freq: Every day | ORAL | 2 refills | Status: DC
Start: 2020-06-04 — End: 2021-02-16

## 2020-06-04 NOTE — Progress Notes (Signed)
Subjective  CC:  Chief Complaint  Patient presents with  . Transitions Of Care    HPI: Kyle Haynes is a 59 y.o. male who presents to [facility] today to establish care with me as a new patient.  I reviewed multiple records from primary care, most recent lab results.  He was last here over a year ago.  He has the following concerns or needs:  59 year old technically married male who lives alone.  He was diagnosed with possible bipolar disorder anxiety disorder by Dr. Earlene Plater.  He has been on Prozac and Lamictal and says he is doing well.  No adverse effects.  Prediabetes: Last checked a year ago.  He denies symptoms of hyperglycemia.  No history of diabetes.  He has gained weight over the last year.  Hypertriglyceridemia: Never treated.  Obesity with weight gain: He is ready to start exercising again and eating better.  He would like to lose weight.  He has been on phentermine in the past from Dr. Earlene Plater and would like this refilled.  He denies history of hypertension.  No adverse effects.  Bilateral carpal tunnel syndrome and trigger finger: He is seen sports medicine for both.  Both are currently active but not terribly bothersome.  He is not currently receiving treatment.  Health maintenance: He is due for lab work, flu shot today.  He has been vaccinated against Covid.  Colonoscopy and other immunizations are up-to-date.  He does request a medical clearance form for deep sea diving be completed.  Assessment  1. Annual physical exam   2. Bipolar 2 disorder (HCC) Chronic  3. Prediabetes   4. Hypertriglyceridemia   5. Obesity (BMI 30-39.9)   6. Bilateral carpal tunnel syndrome   7. Trigger finger, unspecified finger, unspecified laterality      Plan   Complete physical: Routine guidance given.  Discussion of healthy diet and exercise recommended.  Flu shot updated today.  Possible bipolar disorder with anxiety: Continue current  management..  If symptoms resurface or worsen would recommend psychiatry referral.  Patient agrees.  History of prediabetes and hypertriglyceridemia: Check lab work today.  Follow-up accordingly.  Obesity: Short-term restarted.  Education on risk and benefits discussed.  Carpal tunnel syndrome: Mild symptoms.  Patient elects no treatment at this time.  Trigger finger: Intermittent and patient elects no treatment at this time.  Follow up: 3 months to recheck blood pressure and weight Orders Placed This Encounter  Procedures  . CBC with Differential/Platelet  . COMPLETE METABOLIC PANEL WITH GFR  . Lipid panel  . TSH  . Hemoglobin A1c   Meds ordered this encounter  Medications  . phentermine (ADIPEX-P) 37.5 MG tablet    Sig: Take 1 tablet (37.5 mg total) by mouth daily before breakfast.    Dispense:  30 tablet    Refill:  2     Depression screen [facility] 2/9 05/12/2019 02/12/2018 01/18/2017  Decreased Interest 0 0 0  Down, Depressed, Hopeless 0 0 0  PHQ - 2 Score 0 0 0  Altered sleeping 1 - -  Tired, decreased energy 0 - -  Change in appetite 0 - -  Feeling bad or failure about yourself  0 - -  Trouble concentrating 0 - -  Moving slowly or fidgety/restless 0 - -  Suicidal thoughts 0 - -  PHQ-9 Score 1 - -  Difficult doing work/chores Not difficult at all - -    We updated and reviewed the  patient's past history in detail and it is documented below.  Patient Active Problem List   Diagnosis Date Noted  . 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 Osteoarthritis of left hip 03/13/2018  . Mixed hyperlipidemia 07/22/2017  . Bipolar 2 disorder (HCC) 03/09/2017    .   Marland Kitchen Bilateral carpal tunnel syndrome 01/23/2017  . Chronic midline low back pain without sciatica 01/18/2017  . Obesity (BMI 30-39.9) 01/18/2017  . Family history of prostate cancer 03/09/2016  . GAD (generalized anxiety disorder) 03/09/2016   Health Maintenance  Topic Date Due  .  COVID-19 Vaccine (1) Never done  . INFLUENZA VACCINE  04/18/2020  . COLONOSCOPY  03/31/2026  . TETANUS/TDAP  04/14/2027  . Hepatitis C Screening  Completed  . HIV Screening  Discontinued   Immunization History  Administered Date(s) Administered  . Influenza,inj,Quad PF,6+ Mos 06/23/2018  . Tdap 04/13/2017   Current Meds  Medication Sig  . EPINEPHrine 0.3 mg/0.3 mL IJ SOAJ injection PLEASE SEE ATTACHED FOR DETAILED DIRECTIONS  . escitalopram (LEXAPRO) 20 MG tablet TAKE 1 TABLET BY MOUTH EVERY DAY  . lamoTRIgine (LAMICTAL) 100 MG tablet TAKE 1 TABLET BY MOUTH EVERY DAY    Allergies: Patient is allergic to yellow jacket venom [bee venom]. Past Medical History Patient  has a past medical history of AC separation, left, initial encounter (01/23/2017), Allergic reaction to insect sting, Allergy, Angio-edema, Anxiety, History of chicken pox, and Urticaria. Past Surgical History Patient  has a past surgical history that includes Eye surgery (Left, 1972); Vasectomy (2010); and Colonoscopy (03/31/2016). Family History: Patient family history includes Diabetes in his mother; Prostate cancer in his father. Social History:  Patient  reports that he has never smoked. He has never used smokeless tobacco. He reports previous drug use. Drug: Marijuana. He reports that he does not drink alcohol.  Review of Systems: Constitutional: negative for fever or malaise Ophthalmic: negative for photophobia, double vision or loss of vision Cardiovascular: negative for chest pain, dyspnea on exertion, or new LE swelling Respiratory: negative for SOB or persistent cough Gastrointestinal: negative for abdominal pain, change in bowel habits or melena Genitourinary: negative for dysuria or gross hematuria Musculoskeletal: negative for new gait disturbance or muscular weakness Integumentary: negative for new or persistent rashes Neurological: negative for TIA or stroke symptoms Psychiatric: negative for SI or  delusions Allergic/Immunologic: negative for hives  Patient Care Team    Relationship Specialty Notifications Start End  Willow Ora, MD PCP - General Family Medicine  06/04/20     Objective  Vitals: BP 120/78   Pulse 82   Temp 98.6 F (37 C) (Temporal)   Ht 5\' 8"  (1.727 m)   Wt 258 lb 9.6 oz (117.3 kg)   SpO2 95%   BMI 39.32 kg/m  General:  Well developed, well nourished, no acute distress  Psych:  Alert and oriented,normal mood and affect HEENT:  Normocephalic, atraumatic, non-icteric sclera, supple neck without adenopathy, mass or thyromegaly Cardiovascular:  RRR without gallop, rub or murmur Respiratory:  Good breath sounds bilaterally, CTAB with normal respiratory effort Gastrointestinal: normal bowel sounds, soft, non-tender, no noted masses. No HSM MSK: no deformities, contusions. Joints are without erythema or swelling Skin:  Warm, no rashes or suspicious lesions noted Neurologic:    Mental status is normal. Gross motor and sensory exams are normal. Normal gait   Commons side effects, risks, benefits, and alternatives for medications and treatment plan prescribed today were discussed, and  the patient expressed understanding of the given instructions. Patient is instructed to call or message via MyChart if he/she has any questions or concerns regarding our treatment plan. No barriers to understanding were identified. We discussed Red Flag symptoms and signs in detail. Patient expressed understanding regarding what to do in case of urgent or emergency type symptoms.   Medication list was reconciled, printed and provided to the patient in AVS. Patient instructions and summary information was reviewed with the patient as documented in the AVS. This note was prepared with assistance of Dragon voice recognition software. Occasional wrong-word or sound-a-like substitutions may have occurred due to the inherent limitations of voice recognition software  This visit occurred during  the SARS-CoV-2 public health emergency.  Safety protocols were in place, including screening questions prior to the visit, additional usage of staff PPE, and extensive cleaning of exam room while observing appropriate contact time as indicated for disinfecting solutions.

## 2020-06-04 NOTE — Patient Instructions (Signed)
Please return in 3 months to recheck your weight and blood pressure.  Please schedule a lab visit for next week to check your fasting labs.   I have ordered your appetite suppressant medication as requested.    It was a pleasure meeting you today! Thank you for choosing [place] to meet your healthcare needs! I truly look forward to working with you. If you have any questions or concerns, please send me a message via Mychart or call the office at [phone].

## 2020-06-09 ENCOUNTER — Other Ambulatory Visit: Payer: 59

## 2020-06-09 ENCOUNTER — Other Ambulatory Visit: Payer: Self-pay

## 2020-06-09 DIAGNOSIS — Z0000 Encounter for general adult medical examination without abnormal findings: Secondary | ICD-10-CM

## 2020-06-09 DIAGNOSIS — E781 Pure hyperglyceridemia: Secondary | ICD-10-CM

## 2020-06-09 DIAGNOSIS — G5603 Carpal tunnel syndrome, bilateral upper limbs: Secondary | ICD-10-CM

## 2020-06-09 DIAGNOSIS — R7303 Prediabetes: Secondary | ICD-10-CM

## 2020-06-10 LAB — HEMOGLOBIN A1C
Hgb A1c MFr Bld: 5.7 % of total Hgb — ABNORMAL HIGH (ref <5.7)
Mean Plasma Glucose: 117 (calc)
eAG (mmol/L): 6.5 (calc)

## 2020-06-10 LAB — COMPLETE METABOLIC PANEL WITH GFR
AG Ratio: 1.4 (calc) (ref 1.0–2.5)
ALT: 24 U/L (ref 9–46)
AST: 25 U/L (ref 10–35)
Albumin: 4.2 g/dL (ref 3.6–5.1)
Alkaline phosphatase (APISO): 61 U/L (ref 35–144)
BUN: 15 mg/dL (ref 7–25)
CO2: 26 mmol/L (ref 20–32)
Calcium: 9.2 mg/dL (ref 8.6–10.3)
Chloride: 106 mmol/L (ref 98–110)
Creat: 0.94 mg/dL (ref 0.70–1.33)
GFR, Est African American: 102 mL/min/{1.73_m2} (ref >=60)
GFR, Est Non African American: 88 mL/min/{1.73_m2} (ref >=60)
Globulin: 2.9 g/dL (calc) (ref 1.9–3.7)
Glucose, Bld: 95 mg/dL (ref 65–99)
Potassium: 4.8 mmol/L (ref 3.5–5.3)
Sodium: 137 mmol/L (ref 135–146)
Total Bilirubin: 0.5 mg/dL (ref 0.2–1.2)
Total Protein: 7.1 g/dL (ref 6.1–8.1)

## 2020-06-10 LAB — LIPID PANEL
Cholesterol: 208 mg/dL — ABNORMAL HIGH (ref <200)
HDL: 27 mg/dL — ABNORMAL LOW (ref >=40)
LDL Cholesterol (Calc): 149 mg/dL (calc) — ABNORMAL HIGH
Non-HDL Cholesterol (Calc): 181 mg/dL (calc) — ABNORMAL HIGH (ref <130)
Total CHOL/HDL Ratio: 7.7 (calc) — ABNORMAL HIGH (ref <5.0)
Triglycerides: 187 mg/dL — ABNORMAL HIGH (ref <150)

## 2020-06-10 LAB — CBC WITH DIFFERENTIAL/PLATELET
Absolute Monocytes: 346 cells/uL (ref 200–950)
Basophils Absolute: 32 cells/uL (ref 0–200)
Basophils Relative: 0.6 %
Eosinophils Absolute: 81 cells/uL (ref 15–500)
Eosinophils Relative: 1.5 %
HCT: 42.1 % (ref 38.5–50.0)
Hemoglobin: 14.1 g/dL (ref 13.2–17.1)
Lymphs Abs: 1939 cells/uL (ref 850–3900)
MCH: 29.7 pg (ref 27.0–33.0)
MCHC: 33.5 g/dL (ref 32.0–36.0)
MCV: 88.8 fL (ref 80.0–100.0)
MPV: 11.5 fL (ref 7.5–12.5)
Monocytes Relative: 6.4 %
Neutro Abs: 3002 cells/uL (ref 1500–7800)
Neutrophils Relative %: 55.6 %
Platelets: 309 10*3/uL (ref 140–400)
RBC: 4.74 10*6/uL (ref 4.20–5.80)
RDW: 13.3 % (ref 11.0–15.0)
Total Lymphocyte: 35.9 %
WBC: 5.4 10*3/uL (ref 3.8–10.8)

## 2020-06-10 LAB — TSH: TSH: 1.68 mIU/L (ref 0.40–4.50)

## 2020-06-14 ENCOUNTER — Other Ambulatory Visit: Payer: Self-pay

## 2020-06-14 ENCOUNTER — Telehealth: Payer: Self-pay | Admitting: Family Medicine

## 2020-06-14 DIAGNOSIS — F3181 Bipolar II disorder: Secondary | ICD-10-CM

## 2020-06-14 MED ORDER — LAMOTRIGINE 100 MG PO TABS: 100.0000 mg | ORAL_TABLET | Freq: Every day | ORAL | 3 refills | Status: DC

## 2020-06-14 NOTE — Telephone Encounter (Signed)
Patient is requesting a High Cholestrol medication as discussed about in previous Appt, patient stated he going to work on sugar intake.

## 2020-06-14 NOTE — Telephone Encounter (Signed)
Please advise 

## 2020-06-14 NOTE — Telephone Encounter (Signed)
.   LAST APPOINTMENT DATE: 06/04/2020   NEXT APPOINTMENT DATE:@Visit  date not found  MEDICATION:lamoTRIgine (LAMICTAL) 100 MG tablet  [id number] [place], [place] - [address]  **Let patient know to contact pharmacy at the end of the day to make sure medication is ready. **  ** Please notify patient to allow 48-72 hours to process**  **Encourage patient to contact the pharmacy for refills or they can request refills through [facility]**  CLINICAL FILLS OUT ALL BELOW:   LAST REFILL:  QTY:  REFILL DATE:    OTHER COMMENTS:    Okay for refill?  Please advise

## 2020-06-14 NOTE — Telephone Encounter (Signed)
Refill sent to pharmacy.   

## 2020-06-16 MED ORDER — ATORVASTATIN CALCIUM 10 MG PO TABS: 10.0000 mg | ORAL_TABLET | Freq: Every evening | ORAL | 3 refills | Status: DC

## 2020-08-08 ENCOUNTER — Other Ambulatory Visit: Payer: Self-pay | Admitting: Family Medicine

## 2020-08-08 DIAGNOSIS — F3181 Bipolar II disorder: Secondary | ICD-10-CM

## 2021-02-09 ENCOUNTER — Other Ambulatory Visit: Payer: Self-pay | Admitting: Family Medicine

## 2021-02-09 DIAGNOSIS — F3181 Bipolar II disorder: Secondary | ICD-10-CM

## 2021-02-16 ENCOUNTER — Ambulatory Visit: Payer: 59 | Admitting: Family Medicine

## 2021-02-16 ENCOUNTER — Other Ambulatory Visit: Payer: Self-pay

## 2021-02-16 ENCOUNTER — Ambulatory Visit (INDEPENDENT_AMBULATORY_CARE_PROVIDER_SITE_OTHER): Payer: 59

## 2021-02-16 ENCOUNTER — Encounter: Payer: Self-pay | Admitting: Family Medicine

## 2021-02-16 VITALS — BP 138/88 | HR 64 | Temp 98.0°F | Ht 68.0 in | Wt 263.2 lb

## 2021-02-16 DIAGNOSIS — E782 Mixed hyperlipidemia: Secondary | ICD-10-CM

## 2021-02-16 DIAGNOSIS — G5603 Carpal tunnel syndrome, bilateral upper limbs: Secondary | ICD-10-CM | POA: Diagnosis not present

## 2021-02-16 DIAGNOSIS — M25521 Pain in right elbow: Secondary | ICD-10-CM

## 2021-02-16 DIAGNOSIS — F3181 Bipolar II disorder: Secondary | ICD-10-CM

## 2021-02-16 MED ORDER — PHENTERMINE HCL 37.5 MG PO TABS: 37.5000 mg | ORAL_TABLET | Freq: Every day | ORAL | 0 refills | Status: DC

## 2021-02-16 MED ORDER — ESCITALOPRAM OXALATE 20 MG PO TABS: 20.0000 mg | ORAL_TABLET | Freq: Every day | ORAL | 1 refills | Status: DC

## 2021-02-16 MED ORDER — DICLOFENAC SODIUM 75 MG PO TBEC: 75.0000 mg | DELAYED_RELEASE_TABLET | Freq: Two times a day (BID) | ORAL | 0 refills | Status: DC

## 2021-02-16 NOTE — Patient Instructions (Signed)
Please return in 4 weeks for weight and blood pressure recheck.   We will call you to get your carpal tunnel testing scheduled.   Please purchase carpal tunnel wrist splints and take the voltaren twice a day with food for 7-10 days.   If you have any questions or concerns, please don't hesitate to send me a message via MyChart or call the office at [phone]. Thank you for visiting with [place] today! It's our pleasure caring for you.

## 2021-02-16 NOTE — Progress Notes (Signed)
Subjective  CC:  Chief Complaint  Patient presents with  . Elbow Pain    Right arm, stiffens up at night has to grab arm with left hand to even move arm.  . Numbness    Both hands and feet started going numb yesterday, has happened before about 3 times. Thinks circulation is bad.    HPI: Kyle Haynes is a 60 y.o. male who presents to the office today to address the problems listed above in the chief complaint.  60 year old with history of bilateral carpal tunnel complains of bilateral hand numbness intermittently.  Notices at night but sometimes during the day.  Denies weakness.  States that it includes his pinky finger.  Has tried bulky wrist splint in the past but could not sleep with it on.  Has not tried other methods of treatment.  No hand pain.  Would like further testing.  Complains of right elbow pain at night while he sleeping.  If he lies on his right side and has his right arm extended he awakens with significant elbow pain.  Once he moves the arm feels better.  He denies shoulder pain, numbness or tingling but associated with the pain or weakness.  He never has the symptoms during the day.  No swelling or redness of the joint.  No other joint pain.  Yesterday he complains that when he was walking barefoot in his kitchen, he felt momentary numbness at the bottom of his feet.  This is not happened before.  He otherwise feels fine.  Obesity: Wants to lose weight again.  Used phentermine last year.  Would like to try it again.  Now is eating healthy.  We will start a walking program.   Hyperlipidemia: Diagnosed back in September 2021.  Was recommended to start Lipitor.   Assessment  1. Right elbow pain   2. Bilateral carpal tunnel syndrome   3. Morbid obesity (HCC)   4. Mixed hyperlipidemia   5. Bipolar 2 disorder (HCC)      Plan   Right elbow pain: Only occurs with hyperextension during sleep.  Does not sound neuropathic.  Will check elbow x-ray, 1 to 2-week course  of Voltaren and then recheck.  Possible bilateral carpal tunnel: Check nerve conduction studies to verify diagnosis.  Recommended wrist splints and NSAIDs.  Mixed hyperlipidemia: Recommend Lipitor  Morbid obesity: Encourage weight loss.  Discussed healthy diet, exercise program and short course of phentermine.  Reinforced need to recheck in 4 weeks to follow-up on weight loss and recheck blood pressure.  Patient denied shortness medication.  Bipolar 2 disorder: He reports this is stable on Lamictal and Lexapro.  Refilled Lexapro 20 mg daily  Follow up: Return in about 4 weeks (around 03/16/2021) for recheck.  Visit date not found  Orders Placed This Encounter  Procedures  . DG Elbow Complete Right  . NCV with EMG(electromyography)   Meds ordered this encounter  Medications  . phentermine (ADIPEX-P) 37.5 MG tablet    Sig: Take 1 tablet (37.5 mg total) by mouth daily before breakfast.    Dispense:  30 tablet    Refill:  0  . escitalopram (LEXAPRO) 20 MG tablet    Sig: Take 1 tablet (20 mg total) by mouth daily.    Dispense:  90 tablet    Refill:  1  . diclofenac (VOLTAREN) 75 MG EC tablet    Sig: Take 1 tablet (75 mg total) by mouth 2 (two) times daily.    Dispense:  30 tablet    Refill:  0      I reviewed the patients updated PMH, FH, and SocHx.    Patient Active Problem List   Diagnosis Date Noted  . Morbid obesity (HCC) 06/04/2020  . 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 Osteoarthritis of left hip 03/13/2018  . Mixed hyperlipidemia 07/22/2017  . Bipolar 2 disorder (HCC) 03/09/2017  . Bilateral carpal tunnel syndrome 01/23/2017  . Chronic midline low back pain without sciatica 01/18/2017  . Family history of prostate cancer 03/09/2016  . GAD (generalized anxiety disorder) 03/09/2016   Current Meds  Medication Sig  . diclofenac (VOLTAREN) 75 MG EC tablet Take 1 tablet (75 mg total) by mouth 2 (two) times daily.  Marland Kitchen EPINEPHrine 0.3 mg/0.3 mL IJ SOAJ injection PLEASE SEE  ATTACHED FOR DETAILED DIRECTIONS  . lamoTRIgine (LAMICTAL) 100 MG tablet Take 1 tablet (100 mg total) by mouth daily.  . [DISCONTINUED] escitalopram (LEXAPRO) 20 MG tablet Take 1 tablet by mouth daily. Please schedule an appointment for further refills  . [DISCONTINUED] phentermine (ADIPEX-P) 37.5 MG tablet Take 1 tablet (37.5 mg total) by mouth daily before breakfast.    Allergies: Patient is allergic to yellow jacket venom [bee venom]. Family History: Patient family history includes Diabetes in his mother; Prostate cancer in his father. Social History:  Patient  reports that he has never smoked. He has never used smokeless tobacco. He reports previous drug use. Drug: Marijuana. He reports that he does not drink alcohol.  Review of Systems: Constitutional: Negative for fever malaise or anorexia Cardiovascular: negative for chest pain Respiratory: negative for SOB or persistent cough Gastrointestinal: negative for abdominal pain  Objective  Vitals: BP 138/88   Pulse 64   Temp 98 F (36.7 C) (Temporal)   Ht 5\' 8"  (1.727 m)   Wt 263 lb 3.2 oz (119.4 kg)   SpO2 94%   BMI 40.02 kg/m  General: no acute distress , A&Ox3 Cardiovascular:  RRR without murmur or gallop.  Respiratory:  Good breath sounds bilaterally, CTAB with normal respiratory effort Skin:  Warm, no rashes Extremities: Right elbow: Normal exam.  Nontender, full range of motion, negative Phalen's bilaterally, normal grip strength.  Right shoulder normal range of motion without tenderness     Commons side effects, risks, benefits, and alternatives for medications and treatment plan prescribed today were discussed, and the patient expressed understanding of the given instructions. Patient is instructed to call or message via MyChart if he/she has any questions or concerns regarding our treatment plan. No barriers to understanding were identified. We discussed Red Flag symptoms and signs in detail. Patient expressed  understanding regarding what to do in case of urgent or emergency type symptoms.   Medication list was reconciled, printed and provided to the patient in AVS. Patient instructions and summary information was reviewed with the patient as documented in the AVS. This note was prepared with assistance of Dragon voice recognition software. Occasional wrong-word or sound-a-like substitutions may have occurred due to the inherent limitations of voice recognition software  This visit occurred during the SARS-CoV-2 public health emergency.  Safety protocols were in place, including screening questions prior to the visit, additional usage of staff PPE, and extensive cleaning of exam room while observing appropriate contact time as indicated for disinfecting solutions.

## 2021-02-17 NOTE — Progress Notes (Signed)
Please call patient: I have reviewed his/her xray results. Please let him know his elbow shows arthritic changes. This is the cause of his pain. The medication should help calm it down. He may consider an elbow support for nighttime. Thanks

## 2021-03-08 ENCOUNTER — Other Ambulatory Visit: Payer: Self-pay | Admitting: Family Medicine

## 2021-03-08 DIAGNOSIS — F3181 Bipolar II disorder: Secondary | ICD-10-CM

## 2021-03-16 ENCOUNTER — Telehealth: Payer: Self-pay

## 2021-03-16 NOTE — Telephone Encounter (Signed)
Nurse Assessment Nurse: Alexander Mt RN, Nicholaus Bloom Date/Time (Eastern Time): 03/16/2021 9:18:04 AM Confirm and document reason for call. If symptomatic, describe symptoms. ---Caller has numbness on his mouth and hands yesterday and took benadryl had itchiness on his hands. States He fell and hit his head Sat while roller skating. States he has a cyst on the back of his head. And He might of been exposed to Covid 2 weeks ago. He is sleeping a lot and has body aches as well. Does the patient have any new or worsening symptoms? ---Yes Will a triage be completed? ---Yes Related visit to physician within the last 2 weeks? ---No Does the PT have any chronic conditions? (i.e. diabetes, asthma, this includes High risk factors for pregnancy, etc.) ---No Is this a behavioral health or substance abuse call? ---No Guidelines Guideline Title Affirmed Question Affirmed Notes Nurse Date/Time (Eastern Time) Back Pain [1] Age > 50 AND [2] no history of prior similar back pain Alexander Mt, RN, Nicholaus Bloom 03/16/2021 9:27:26 AM PLEASE NOTE: All timestamps contained within this report are represented as [place] Standard Time. CONFIDENTIALTY NOTICE: This fax transmission is intended only for the addressee. It contains information that is legally privileged, confidential or otherwise protected from use or disclosure. If you are not the intended recipient, you are strictly prohibited from reviewing, disclosing, copying using or disseminating any of this information or taking any action in reliance on or regarding this information. If you have received this fax in error, please notify us immediately by telephone so that we can arrange for its return to [place]. Phone: [phone], Toll-Free: [phone], Fax: [phone] Page: 2 of 2 Call Id: [id number] Disp. Time Lamount Cohen Time) Disposition Final User 03/16/2021 9:30:09 AM SEE PCP WITHIN 3 DAYS Yes [place], RN, Prentiss Bells Disagree/Comply Comply Caller Understands  Yes PreDisposition Home Care Care Advice Given Per Guideline SEE PCP WITHIN 3 DAYS: * You need to be seen within 2 or 3 days. * PCP VISIT: Call your doctor (or NP/PA) during regular office hours and make an appointment. A clinic or urgent care center are good places to go for care if your doctor's office is closed or you can't get an appointment. NOTE: If office will be open tomorrow, tell caller to call then, not in 3 days. PAIN MEDICINES: * ACETAMINOPHEN - REGULAR STRENGTH TYLENOL: Take 650 mg (two 325 mg pills) by mouth every 4 to 6 hours as needed. Each Regular Strength Tylenol pill has 325 mg of acetaminophen. The most you should take each day is 3,250 mg (10 pills a day). * IBUPROFEN (E.G., MOTRIN, ADVIL): Take 400 mg (two 200 mg pills) by mouth every 6 hours. The most you should take each day is 1,200 mg (six 200 mg pills), unless your doctor has told you to take more. CALL BACK IF: * Numbness or weakness occurs, or bowel/bladder problems * There are any urine symptoms or fever * You become worse CARE ADVICE given per Back Pain (Adult) guideline

## 2021-03-17 NOTE — Telephone Encounter (Signed)
FYI

## 2021-03-28 ENCOUNTER — Encounter: Payer: Self-pay | Admitting: Family Medicine

## 2021-03-28 ENCOUNTER — Ambulatory Visit: Payer: 59 | Admitting: Family Medicine

## 2021-03-28 ENCOUNTER — Other Ambulatory Visit: Payer: Self-pay

## 2021-03-28 DIAGNOSIS — F411 Generalized anxiety disorder: Secondary | ICD-10-CM | POA: Diagnosis not present

## 2021-03-28 DIAGNOSIS — F3181 Bipolar II disorder: Secondary | ICD-10-CM | POA: Diagnosis not present

## 2021-03-28 DIAGNOSIS — L720 Epidermal cyst: Secondary | ICD-10-CM | POA: Diagnosis not present

## 2021-03-28 MED ORDER — ESCITALOPRAM OXALATE 10 MG PO TABS: 10.0000 mg | ORAL_TABLET | Freq: Every day | ORAL | 5 refills | Status: DC

## 2021-03-28 MED ORDER — ESCITALOPRAM OXALATE 20 MG PO TABS: 20.0000 mg | ORAL_TABLET | Freq: Every day | ORAL | 0 refills | Status: DC

## 2021-03-28 MED ORDER — PHENTERMINE HCL 37.5 MG PO TABS: 37.5000 mg | ORAL_TABLET | Freq: Every day | ORAL | 3 refills | Status: DC

## 2021-03-28 NOTE — Patient Instructions (Signed)
Please return in 3 months for your annual complete physical; please come fasting.   You may stop the lamictal and decrease the lexapro to 10mg  daily. I have ordered the lower dose for you.   If you have any questions or concerns, please don't hesitate to send me a message via MyChart or call the office at [phone]. Thank you for visiting with [phone] today! It's our pleasure caring for you.

## 2021-03-28 NOTE — Progress Notes (Signed)
Subjective  CC:  Chief Complaint  Patient presents with   Obesity    Down 2.2 lbs since last visit    Cyst    Back of his head   Anxiety    HPI: Kyle Haynes is a 60 y.o. male who presents to the office today to address the problems listed above in the chief complaint. Obesity follow-up: We started phentermine last month.  He reports he likes it.  Uses it only intermittently.  Does decrease sleep if he takes it after 7 AM.  Does decreased appetite.  Weight loss is minimal.  Continues to struggle with exercise program.  No adverse effects.  No mood effects.  Blood pressure remains normal. Mood: He does not think he has bipolar disorder.  He is on Lamictal and Lexapro.  He reports he never had mood problems until his ex-wife recommended treatment for bipolar disorder.  Both of her sons have bipolar disorder.  His last doctor started Lamictal and Lexapro.  He took them because he did not feel they were harmful.  He would like to wean off medications.  He denies symptoms of depression.  No history of mania.  No prior diagnosis of depression.  He does admit that he had some anxiety symptoms that he related to a stressful job. He is noted a lump on the back of his scalp.  Nontender Wt Readings from Last 3 Encounters:  03/28/21 260 lb 12.8 oz (118.3 kg)  02/16/21 263 lb 3.2 oz (119.4 kg)  06/04/20 258 lb 9.6 oz (117.3 kg)    Assessment  1. Morbid obesity (HCC)   2. Bipolar 2 disorder (HCC)   3. Epidermal cyst   4. GAD (generalized anxiety disorder)      Plan  Morbid obesity: Improving.  Continue phentermine.  Recommend exercise Mood disorder: We will review chart.  Possible anxiety disorder versus situational anxiety.  He denies chronic depression.  Will stop Lamictal and slowly titrate off Lexapro monitoring mood symptoms.  Recheck 3 months.  Education given. Epidermal cyst: Reassured  Follow up: 3 months for weight, mood recheck and physical Visit date not found  No orders of  the defined types were placed in this encounter.  Meds ordered this encounter  Medications   DISCONTD: escitalopram (LEXAPRO) 20 MG tablet    Sig: Take 1 tablet (20 mg total) by mouth daily.    Dispense:  90 tablet    Refill:  0   escitalopram (LEXAPRO) 10 MG tablet    Sig: Take 1 tablet (10 mg total) by mouth daily.    Dispense:  30 tablet    Refill:  5   phentermine (ADIPEX-P) 37.5 MG tablet    Sig: Take 1 tablet (37.5 mg total) by mouth daily before breakfast.    Dispense:  30 tablet    Refill:  3       I reviewed the patients updated PMH, FH, and SocHx.    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   diclofenac (VOLTAREN) 75 MG EC tablet Take 1 tablet (75 mg total) by mouth 2 (two) times daily.   EPINEPHrine 0.3 mg/0.3 mL IJ SOAJ injection PLEASE SEE ATTACHED FOR DETAILED DIRECTIONS   [DISCONTINUED] escitalopram (LEXAPRO) 20 MG tablet TAKE 1 TABLET BY MOUTH DAILY. PLEASE SCHEDULE AN APPOINTMENT FOR FURTHER REFILLS   [DISCONTINUED] lamoTRIgine (LAMICTAL) 100 MG tablet Take 1 tablet (100 mg total) by mouth daily.   [DISCONTINUED] phentermine (ADIPEX-P) 37.5 MG tablet Take 1 tablet (37.5 mg total) by mouth daily before breakfast.    Allergies: Patient is allergic to yellow jacket venom [bee venom]. Family History: Patient family history includes Diabetes in his mother; Prostate cancer in his father. Social History:  Patient  reports that he has never smoked. He has never  used smokeless tobacco. He reports previous drug use. Drug: Marijuana. He reports that he does not drink alcohol.  Review of Systems: Constitutional: Negative for fever malaise or anorexia Cardiovascular: negative for chest pain Respiratory: negative for SOB or persistent cough Gastrointestinal: negative for abdominal pain  Objective  Vitals: BP 130/70   Pulse 66   Temp 97.6 F (36.4 C) (Temporal)   Wt 260 lb 12.8 oz (118.3 kg)   SpO2 96%   BMI 39.65 kg/m  General: no acute distress , A&Ox3 HEENT: PEERL, conjunctiva normal, neck is supple Posterior scalp with 0.5 cm nontender mobile cystic mass   Commons side effects, risks, benefits, and alternatives for medications and treatment plan prescribed today were discussed, and the patient expressed understanding of the given instructions. Patient is instructed to call or message via MyChart if he/she has any questions or concerns regarding our treatment plan. No barriers to understanding were identified. We discussed Red Flag symptoms and signs in detail. Patient expressed understanding regarding what to do in case of urgent or emergency type symptoms.  Medication list was reconciled, printed and provided to the patient in AVS. Patient instructions and summary information was reviewed with the patient as documented in the AVS. This note was prepared with assistance of Dragon voice recognition software. Occasional wrong-word or sound-a-like substitutions may have occurred due to the inherent limitations of voice recognition software  This visit occurred during the SARS-CoV-2 public health emergency.  Safety protocols were in place, including screening questions prior to the visit, additional usage of staff PPE, and extensive cleaning of exam room while observing appropriate contact time as indicated for disinfecting solutions.

## 2021-04-14 ENCOUNTER — Other Ambulatory Visit: Payer: Self-pay | Admitting: Nurse Practitioner

## 2021-04-14 ENCOUNTER — Other Ambulatory Visit: Payer: Self-pay

## 2021-04-14 ENCOUNTER — Ambulatory Visit
Admission: RE | Admit: 2021-04-14 | Discharge: 2021-04-14 | Disposition: A | Discharge status: Home / Self Care | Payer: Self-pay | Source: Ambulatory Visit | Attending: Nurse Practitioner | Admitting: Nurse Practitioner

## 2021-04-14 DIAGNOSIS — R52 Pain, unspecified: Secondary | ICD-10-CM

## 2021-04-26 ENCOUNTER — Other Ambulatory Visit: Payer: Self-pay | Admitting: Family Medicine

## 2021-04-26 DIAGNOSIS — F3181 Bipolar II disorder: Secondary | ICD-10-CM

## 2021-07-03 ENCOUNTER — Other Ambulatory Visit: Payer: Self-pay | Admitting: Family Medicine

## 2021-07-03 DIAGNOSIS — F3181 Bipolar II disorder: Secondary | ICD-10-CM

## 2021-07-04 ENCOUNTER — Other Ambulatory Visit: Payer: Self-pay | Admitting: Family Medicine

## 2021-07-04 DIAGNOSIS — F3181 Bipolar II disorder: Secondary | ICD-10-CM

## 2021-07-08 ENCOUNTER — Ambulatory Visit (INDEPENDENT_AMBULATORY_CARE_PROVIDER_SITE_OTHER): Payer: 59 | Admitting: Family Medicine

## 2021-07-08 ENCOUNTER — Encounter: Payer: Self-pay | Admitting: Family Medicine

## 2021-07-08 ENCOUNTER — Other Ambulatory Visit: Payer: Self-pay

## 2021-07-08 VITALS — BP 118/78 | HR 67 | Temp 98.4°F | Ht 68.0 in | Wt 236.0 lb

## 2021-07-08 DIAGNOSIS — M545 Low back pain, unspecified: Secondary | ICD-10-CM

## 2021-07-08 DIAGNOSIS — F411 Generalized anxiety disorder: Secondary | ICD-10-CM | POA: Diagnosis not present

## 2021-07-08 DIAGNOSIS — Z0000 Encounter for general adult medical examination without abnormal findings: Secondary | ICD-10-CM

## 2021-07-08 DIAGNOSIS — F3181 Bipolar II disorder: Secondary | ICD-10-CM | POA: Diagnosis not present

## 2021-07-08 DIAGNOSIS — E669 Obesity, unspecified: Secondary | ICD-10-CM | POA: Diagnosis not present

## 2021-07-08 DIAGNOSIS — Z23 Encounter for immunization: Secondary | ICD-10-CM | POA: Diagnosis not present

## 2021-07-08 DIAGNOSIS — G8929 Other chronic pain: Secondary | ICD-10-CM

## 2021-07-08 DIAGNOSIS — E782 Mixed hyperlipidemia: Secondary | ICD-10-CM

## 2021-07-08 DIAGNOSIS — M1612 Unilateral primary osteoarthritis, left hip: Secondary | ICD-10-CM

## 2021-07-08 LAB — CBC WITH DIFFERENTIAL/PLATELET
Basophils Absolute: 0 10*3/uL (ref 0.0–0.1)
Basophils Relative: 0.6 % (ref 0.0–3.0)
Eosinophils Absolute: 0.1 10*3/uL (ref 0.0–0.7)
Eosinophils Relative: 1 % (ref 0.0–5.0)
HCT: 43.8 % (ref 39.0–52.0)
Hemoglobin: 14.8 g/dL (ref 13.0–17.0)
Lymphocytes Relative: 31.6 % (ref 12.0–46.0)
Lymphs Abs: 1.8 10*3/uL (ref 0.7–4.0)
MCHC: 33.7 g/dL (ref 30.0–36.0)
MCV: 87.1 fl (ref 78.0–100.0)
Monocytes Absolute: 0.3 10*3/uL (ref 0.1–1.0)
Monocytes Relative: 5.7 % (ref 3.0–12.0)
Neutro Abs: 3.4 10*3/uL (ref 1.4–7.7)
Neutrophils Relative %: 61.1 % (ref 43.0–77.0)
Platelets: 260 10*3/uL (ref 150.0–400.0)
RBC: 5.02 Mil/uL (ref 4.22–5.81)
RDW: 14.2 % (ref 11.5–15.5)
WBC: 5.6 10*3/uL (ref 4.0–10.5)

## 2021-07-08 LAB — LIPID PANEL
Cholesterol: 218 mg/dL — ABNORMAL HIGH (ref 0–200)
HDL: 30 mg/dL — ABNORMAL LOW (ref >39.00)
LDL Cholesterol: 157 mg/dL — ABNORMAL HIGH (ref 0–99)
NonHDL: 188.12
Total CHOL/HDL Ratio: 7
Triglycerides: 156 mg/dL — ABNORMAL HIGH (ref 0.0–149.0)
VLDL: 31.2 mg/dL (ref 0.0–40.0)

## 2021-07-08 LAB — COMPREHENSIVE METABOLIC PANEL
ALT: 24 U/L (ref 0–53)
AST: 27 U/L (ref 0–37)
Albumin: 4.6 g/dL (ref 3.5–5.2)
Alkaline Phosphatase: 70 U/L (ref 39–117)
BUN: 21 mg/dL (ref 6–23)
CO2: 25 mEq/L (ref 19–32)
Calcium: 10.2 mg/dL (ref 8.4–10.5)
Chloride: 103 mEq/L (ref 96–112)
Creatinine, Ser: 0.99 mg/dL (ref 0.40–1.50)
GFR: 82.81 mL/min (ref >60.00)
Glucose, Bld: 91 mg/dL (ref 70–99)
Potassium: 4.8 mEq/L (ref 3.5–5.1)
Sodium: 137 mEq/L (ref 135–145)
Total Bilirubin: 0.6 mg/dL (ref 0.2–1.2)
Total Protein: 8 g/dL (ref 6.0–8.3)

## 2021-07-08 LAB — TSH: TSH: 2.72 u[IU]/mL (ref 0.35–5.50)

## 2021-07-08 MED ORDER — ESCITALOPRAM OXALATE 10 MG PO TABS: 10.0000 mg | ORAL_TABLET | Freq: Every day | ORAL | 1 refills | Status: DC

## 2021-07-08 MED ORDER — PHENTERMINE HCL 37.5 MG PO TABS: 37.5000 mg | ORAL_TABLET | Freq: Every day | ORAL | 2 refills | Status: AC

## 2021-07-08 NOTE — Patient Instructions (Signed)
Please return in 6 months to recheck mood and receive your 2nd Shingrix vaccination.  I will release your lab results to you on your MyChart account with further instructions. Please reply with any questions.    If you have any questions or concerns, please don't hesitate to send me a message via MyChart or call the office at [phone]. Thank you for visiting with [place] today! It's our pleasure caring for you.
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
## 2021-07-12 ENCOUNTER — Telehealth: Payer: Self-pay

## 2021-07-12 MED ORDER — ATORVASTATIN CALCIUM 10 MG PO TABS: 10.0000 mg | ORAL_TABLET | Freq: Every evening | ORAL | 3 refills | Status: AC

## 2021-07-12 NOTE — Telephone Encounter (Signed)
..   Encourage patient to contact the pharmacy for refills or they can request refills through [facility]  LAST APPOINTMENT DATE:  07/08/2021  NEXT APPOINTMENT DATE: na  MEDICATION:  lipitor  Is the patient out of medication?   PHARMACY:  [facility] at [address]  Let patient know to contact pharmacy at the end of the day to make sure medication is ready.  Please notify patient to allow 48-72 hours to process  CLINICAL FILLS OUT ALL BELOW:   LAST REFILL:  QTY:  REFILL DATE:    OTHER COMMENTS: Please give patient a call once sent in.  Patient would like to be able to pick this script up along with the two scripts sent in on 10/21.   Okay for refill?  Please advise

## 2021-07-12 NOTE — Telephone Encounter (Signed)
Medication has been sent in 

## 2021-07-19 ENCOUNTER — Telehealth: Payer: Self-pay

## 2021-07-19 ENCOUNTER — Other Ambulatory Visit: Payer: Self-pay

## 2021-07-19 MED ORDER — EPINEPHRINE 0.3 MG/0.3ML IJ SOAJ: INTRAMUSCULAR | 1 refills | Status: AC

## 2021-07-19 NOTE — Telephone Encounter (Signed)
LM that refill was sent.  

## 2021-07-19 NOTE — Telephone Encounter (Signed)
  Encourage patient to contact the pharmacy for refills or they can request refills through [facility]  LAST APPOINTMENT DATE:  07/08/21  NEXT APPOINTMENT DATE: 01/18/22   MEDICATION: EPINEPHrine 0.3 mg/0.3 mL IJ SOAJ injection  Is the patient out of medication? yes  PHARMACY: [facility] [phone] - [place], [place] - [address] AT [facility] OF [address]  Let patient know to contact pharmacy at the end of the day to make sure medication is ready.  Please notify patient to allow 48-72 hours to process

## 2021-11-21 ENCOUNTER — Other Ambulatory Visit: Payer: Self-pay

## 2021-11-21 ENCOUNTER — Ambulatory Visit: Payer: 59 | Admitting: Family Medicine

## 2021-11-21 ENCOUNTER — Encounter: Payer: Self-pay | Admitting: Family Medicine

## 2021-11-21 VITALS — BP 124/84 | HR 68 | Temp 98.0°F | Ht 68.0 in

## 2021-11-21 DIAGNOSIS — Z23 Encounter for immunization: Secondary | ICD-10-CM | POA: Diagnosis not present

## 2021-11-21 DIAGNOSIS — M25562 Pain in left knee: Secondary | ICD-10-CM

## 2021-11-21 DIAGNOSIS — G8929 Other chronic pain: Secondary | ICD-10-CM | POA: Diagnosis not present

## 2021-11-21 MED ORDER — TRAMADOL HCL 50 MG PO TABS: 50.0000 mg | ORAL_TABLET | Freq: Two times a day (BID) | ORAL | 0 refills | Status: AC | PRN

## 2021-11-21 NOTE — Addendum Note (Signed)
Addended by: Janeece Riggers D on: 11/21/2021 04:26 PM ? ? Modules accepted: Orders ? ?

## 2021-11-21 NOTE — Patient Instructions (Signed)
Please follow up as scheduled for your next visit with me: 01/18/2022  ? ?Take the tramadol as needed for pain; test it out at night to ensure it doesn't make you sleepy or woozy.  ?You may use it with tylenol or advil.  ? ?We will call you to get you back with [facility] for further knee evaluation.  ? ?If you have any questions or concerns, please don't hesitate to send me a message via MyChart or call the office at [phone]. Thank you for visiting with [place] today! It's our pleasure caring for you.  ?

## 2021-11-21 NOTE — Progress Notes (Signed)
? ?Subjective  ?CC:  ?Chief Complaint  ?Patient presents with  ? Knee Pain  ?  Left; Hurt on the job in 02/2021. WComp was denied. Pt says that he saw Ortho in July 2022 and was told it is possibly a torn Meniscus. He states that Advil/Ibuprofen does not work. Not getting sleep due to pian.   ? ? ?HPI: Kyle Haynes is a 61 y.o. male who presents to the office today to address the problems listed above in the chief complaint. ?61 year old male with chronic persistent left knee pain.  He reports he was injured at work back in June 2022.  He was kneeling down in a tight space and felt acute pain in the left knee.  The next day when he was trying to step up onto a high step he felt a pop and acute pain.  He did get evaluated by Workmen's Compensation and orthopedics.  However his claim was eventually turned down.  He was unable to get an MRI.  He reports orthopedist was worried about a possible meniscal injury.  He did have a steroid injection which was not helpful.  He continues to have persistent pain but over the last 2 weeks pain has flared again.  Went and walked 2 weeks ago and now is having increased daily pain.  Possibly increased swelling but no redness or warmth.  No locking or give way.  Advil is not helpful. ?Second Shingrix due ?Assessment  ?1. Chronic pain of left knee   ? ?  ?Plan  ?Chronic pain left knee: Possible internal derangement.  Refer back to orthopedics.  Reviewed MRI.  Tramadol as needed for pain over the next couple weeks.  Continue Advil and/or Tylenol.  Reviewed normal x-ray last year of left knee. ?Second Shingrix given today. ? ?Follow up: As scheduled ?01/18/2022 ? ?Orders Placed This Encounter  ?Procedures  ? Ambulatory referral to Orthopedic Surgery  ? ?Meds ordered this encounter  ?Medications  ? traMADol (ULTRAM) 50 MG tablet  ?  Sig: Take 1 tablet (50 mg total) by mouth 2 (two) times daily as needed for up to 5 days (knee pain).  ?  Dispense:  20 tablet  ?  Refill:  0  ? ?  ? ?I  reviewed the patients updated PMH, FH, and SocHx.  ?  ?Patient Active Problem List  ? Diagnosis Date Noted  ? 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 Osteoarthritis of left hip 03/13/2018  ? Mixed hyperlipidemia 07/22/2017  ? Bipolar 2 disorder (HCC) 03/09/2017  ? Bilateral carpal tunnel syndrome 01/23/2017  ? Chronic midline low back pain without sciatica 01/18/2017  ? Family history of prostate cancer 03/09/2016  ? GAD (generalized anxiety disorder) 03/09/2016  ? ?Current Meds  ?Medication Sig  ? atorvastatin (LIPITOR) 10 MG tablet Take 1 tablet (10 mg total) by mouth at bedtime.  ? EPINEPHrine 0.3 mg/0.3 mL IJ SOAJ injection PLEASE SEE ATTACHED FOR DETAILED DIRECTIONS  ? phentermine (ADIPEX-P) 37.5 MG tablet Take 1 tablet (37.5 mg total) by mouth daily before breakfast.  ? traMADol (ULTRAM) 50 MG tablet Take 1 tablet (50 mg total) by mouth 2 (two) times daily as needed for up to 5 days (knee pain).  ? ? ?Allergies: ?Patient is allergic to yellow jacket venom [bee venom]. ?Family History: ?Patient family history includes Diabetes in his mother; Prostate cancer in his father. ?Social History:  ?Patient  reports that he has never smoked. He has never used smokeless tobacco. He reports that he does not  currently use drugs after having used the following drugs: Marijuana. He reports that he does not drink alcohol. ? ?Review of Systems: ?Constitutional: Negative for fever malaise or anorexia ?Cardiovascular: negative for chest pain ?Respiratory: negative for SOB or persistent cough ?Gastrointestinal: negative for abdominal pain ? ?Objective  ?Vitals: BP 124/84   Pulse 68   Temp 98 ?F (36.7 ?C) (Temporal)   Ht 5\' 8"  (1.727 m)   SpO2 97%   BMI 35.88 kg/m?  ?General: no acute distress , A&Ox3 ?Left knee is normal.:  Medial joint line tenderness and positive pain with McMurray's.  Negative Lachman's.  Normal Gait no effusion ? ? ? ?Commons side effects, risks, benefits, and alternatives for medications and treatment plan  prescribed today were discussed, and the patient expressed understanding of the given instructions. Patient is instructed to call or message via MyChart if he/she has any questions or concerns regarding our treatment plan. No barriers to understanding were identified. We discussed Red Flag symptoms and signs in detail. Patient expressed understanding regarding what to do in case of urgent or emergency type symptoms.  ?Medication list was reconciled, printed and provided to the patient in AVS. Patient instructions and summary information was reviewed with the patient as documented in the AVS. ?This note was prepared with assistance of . Occasional wrong-word or sound-a-like substitutions may have occurred due to the inherent limitations of voice recognition software ? ?This visit occurred during the SARS-CoV-2 public health emergency.  Safety protocols were in place, including screening questions prior to the visit, additional usage of staff PPE, and extensive cleaning of exam room while observing appropriate contact time as indicated for disinfecting solutions.  ? ?

## 2021-11-30 ENCOUNTER — Ambulatory Visit: Payer: 59 | Admitting: Family Medicine

## 2021-12-16 ENCOUNTER — Other Ambulatory Visit: Payer: Self-pay | Admitting: Family Medicine

## 2021-12-16 DIAGNOSIS — F3181 Bipolar II disorder: Secondary | ICD-10-CM

## 2021-12-19 NOTE — Telephone Encounter (Signed)
Is pt taking? Not listed on medication list. ?

## 2022-01-18 ENCOUNTER — Ambulatory Visit: Payer: 59 | Admitting: Family Medicine

## 2022-06-12 ENCOUNTER — Encounter: Payer: Self-pay | Admitting: *Deleted

## 2022-08-31 ENCOUNTER — Encounter: Payer: Self-pay | Admitting: *Deleted
